# Patient Record
Sex: Female | Born: 1940 | Race: White | Hispanic: No | Marital: Married | Smoking: Never smoker
Health system: Southern US, Community
[De-identification: ages and names within clinical notes are randomized; demographics above are authoritative.]

## PROBLEM LIST (undated history)

## (undated) DIAGNOSIS — N200 Calculus of kidney: Secondary | ICD-10-CM

## (undated) DIAGNOSIS — F32A Depression, unspecified: Secondary | ICD-10-CM

## (undated) DIAGNOSIS — R32 Unspecified urinary incontinence: Secondary | ICD-10-CM

## (undated) DIAGNOSIS — H269 Unspecified cataract: Secondary | ICD-10-CM

## (undated) DIAGNOSIS — E785 Hyperlipidemia, unspecified: Secondary | ICD-10-CM

## (undated) DIAGNOSIS — J45909 Unspecified asthma, uncomplicated: Secondary | ICD-10-CM

## (undated) DIAGNOSIS — E079 Disorder of thyroid, unspecified: Secondary | ICD-10-CM

## (undated) DIAGNOSIS — Z9109 Other allergy status, other than to drugs and biological substances: Secondary | ICD-10-CM

## (undated) DIAGNOSIS — Z8719 Personal history of other diseases of the digestive system: Secondary | ICD-10-CM

## (undated) DIAGNOSIS — F419 Anxiety disorder, unspecified: Secondary | ICD-10-CM

## (undated) DIAGNOSIS — N189 Chronic kidney disease, unspecified: Secondary | ICD-10-CM

## (undated) DIAGNOSIS — M1990 Unspecified osteoarthritis, unspecified site: Secondary | ICD-10-CM

## (undated) DIAGNOSIS — F329 Major depressive disorder, single episode, unspecified: Secondary | ICD-10-CM

## (undated) DIAGNOSIS — K219 Gastro-esophageal reflux disease without esophagitis: Secondary | ICD-10-CM

## (undated) DIAGNOSIS — E119 Type 2 diabetes mellitus without complications: Secondary | ICD-10-CM

## (undated) DIAGNOSIS — Z87442 Personal history of urinary calculi: Secondary | ICD-10-CM

## (undated) DIAGNOSIS — I10 Essential (primary) hypertension: Secondary | ICD-10-CM

## (undated) HISTORY — DX: Disorder of thyroid, unspecified: E07.9

## (undated) HISTORY — DX: Depression, unspecified: F32.A

## (undated) HISTORY — DX: Unspecified cataract: H26.9

## (undated) HISTORY — DX: Unspecified asthma, uncomplicated: J45.909

## (undated) HISTORY — DX: Unspecified osteoarthritis, unspecified site: M19.90

## (undated) HISTORY — DX: Other allergy status, other than to drugs and biological substances: Z91.09

## (undated) HISTORY — DX: Chronic kidney disease, unspecified: N18.9

## (undated) HISTORY — PX: KIDNEY STONE SURGERY: SHX686

## (undated) HISTORY — DX: Essential (primary) hypertension: I10

## (undated) HISTORY — DX: Type 2 diabetes mellitus without complications: E11.9

## (undated) HISTORY — DX: Major depressive disorder, single episode, unspecified: F32.9

## (undated) HISTORY — DX: Unspecified urinary incontinence: R32

## (undated) HISTORY — DX: Gastro-esophageal reflux disease without esophagitis: K21.9

## (undated) HISTORY — PX: NASAL SINUS SURGERY: SHX719

## (undated) HISTORY — DX: Calculus of kidney: N20.0

## (undated) HISTORY — DX: Hyperlipidemia, unspecified: E78.5

## (undated) HISTORY — PX: BREAST BIOPSY: SHX20

---

## 1972-07-12 HISTORY — PX: ABDOMINAL HYSTERECTOMY: SHX81

## 1993-07-12 HISTORY — PX: CHOLECYSTECTOMY: SHX55

## 2006-05-11 ENCOUNTER — Ambulatory Visit: Payer: Self-pay | Admitting: Specialist

## 2007-08-01 ENCOUNTER — Ambulatory Visit: Payer: Self-pay | Admitting: Urology

## 2010-06-30 ENCOUNTER — Ambulatory Visit: Payer: Self-pay | Admitting: Orthopedic Surgery

## 2010-10-03 LAB — HM MAMMOGRAPHY: HM Mammogram: NORMAL

## 2010-10-03 LAB — HM PAP SMEAR: HM Pap smear: NORMAL

## 2011-08-02 ENCOUNTER — Ambulatory Visit: Payer: Self-pay | Admitting: Urology

## 2012-10-02 ENCOUNTER — Ambulatory Visit (INDEPENDENT_AMBULATORY_CARE_PROVIDER_SITE_OTHER): Payer: Medicare Other | Admitting: Internal Medicine

## 2012-10-02 ENCOUNTER — Encounter: Payer: Self-pay | Admitting: Internal Medicine

## 2012-10-02 VITALS — BP 142/98 | HR 73 | Temp 98.1°F | Ht 61.5 in | Wt 173.0 lb

## 2012-10-02 DIAGNOSIS — E119 Type 2 diabetes mellitus without complications: Secondary | ICD-10-CM | POA: Insufficient documentation

## 2012-10-02 DIAGNOSIS — I10 Essential (primary) hypertension: Secondary | ICD-10-CM | POA: Insufficient documentation

## 2012-10-02 DIAGNOSIS — E785 Hyperlipidemia, unspecified: Secondary | ICD-10-CM | POA: Insufficient documentation

## 2012-10-02 DIAGNOSIS — Z1239 Encounter for other screening for malignant neoplasm of breast: Secondary | ICD-10-CM | POA: Insufficient documentation

## 2012-10-02 DIAGNOSIS — E039 Hypothyroidism, unspecified: Secondary | ICD-10-CM | POA: Insufficient documentation

## 2012-10-02 DIAGNOSIS — F329 Major depressive disorder, single episode, unspecified: Secondary | ICD-10-CM | POA: Insufficient documentation

## 2012-10-02 NOTE — Assessment & Plan Note (Signed)
Will request records on previous evaluation and management. Will check renal function with labs.

## 2012-10-02 NOTE — Assessment & Plan Note (Signed)
Mammogram ordered today 

## 2012-10-02 NOTE — Progress Notes (Signed)
Subjective:    Patient ID: Brianna Cole, female    DOB: [birth year], 72 y.o.   MRN: [id number]  HPI 72YO female with DM, HTN, HL, CKD3, depression, allergies presents to establish care.  DM - reports BG have been well controlled with Tradjenta. No low blood sugars or BG>250. Unsure last A1c, however this was drawn in 06/2012. HTN - BP generally well controlled at home. Notes difficult to control in the past. Compliant with diltiazem. Notes some constipation on this medication, which has been managable with Senakot. HL - Compliant with pravastatin. No side effects noted from this medication. Depression - Symptoms well controlled with Sertraline and Wellbutrin. CKD3- followed by nephrologist and urologist for chronic kidney stones. No recent issues. Allergies - Followed by allergist. Weekly allergy shots. Symptomatically doing well. No new concerns today.     Outpatient Encounter Prescriptions as of 10/02/2012  Medication Sig Dispense Refill  . albuterol (PROAIR HFA) 108 (90 BASE) MCG/ACT inhaler Inhale 2 puffs into the lungs every 6 (six) hours as needed for wheezing.      Marland Kitchen buPROPion (WELLBUTRIN SR) 150 MG 12 hr tablet Take 150 mg by mouth 2 (two) times daily.       Marland Kitchen diltiazem (CARDIZEM CD) 180 MG 24 hr capsule Take 180 mg by mouth daily.       . fluticasone (FLONASE) 50 MCG/ACT nasal spray Place 2 sprays into the nose daily.      . Fluticasone-Salmeterol (ADVAIR) 100-50 MCG/DOSE AEPB Inhale 1 puff into the lungs every 12 (twelve) hours.      . gabapentin (NEURONTIN) 300 MG capsule Take 300 mg by mouth 3 (three) times daily.       . lansoprazole (PREVACID) 15 MG capsule Take 15 mg by mouth daily.      . montelukast (SINGULAIR) 10 MG tablet Take 10 mg by mouth at bedtime.       . potassium citrate (UROCIT-K) 10 MEQ (1080 MG) SR tablet Take 10 mEq by mouth 4 (four) times daily.       . pravastatin (PRAVACHOL) 20 MG tablet Take 20 mg by mouth daily.       . sertraline (ZOLOFT) 100 MG tablet  Take 100 mg by mouth daily.       Marland Kitchen SYNTHROID 75 MCG tablet Take 75 mcg by mouth daily.       . TRADJENTA 5 MG TABS tablet Take 5 mg by mouth daily.       . Vitamin D, Ergocalciferol, (DRISDOL) 50000 UNITS CAPS Take 50,000 Units by mouth every 7 (seven) days.       No facility-administered encounter medications on file as of 10/02/2012.   BP 142/98  Pulse 73  Temp(Src) 98.1 F (36.7 C) (Oral)  Ht 5' 1.5" (1.562 m)  Wt 173 lb (78.472 kg)  BMI 32.16 kg/m2  SpO2 96%  Review of Systems  Constitutional: Negative for fever, chills, appetite change, fatigue and unexpected weight change.  HENT: Negative for ear pain, congestion, sore throat, trouble swallowing, neck pain, voice change and sinus pressure.   Eyes: Negative for visual disturbance.  Respiratory: Negative for cough, shortness of breath, wheezing and stridor.   Cardiovascular: Negative for chest pain, palpitations and leg swelling.  Gastrointestinal: Negative for nausea, vomiting, abdominal pain, diarrhea, constipation, blood in stool, abdominal distention and anal bleeding.  Genitourinary: Negative for dysuria and flank pain.  Musculoskeletal: Negative for myalgias, arthralgias and gait problem.  Skin: Negative for color change and rash.  Neurological: Negative for dizziness  and headaches.  Hematological: Negative for adenopathy. Does not bruise/bleed easily.  Psychiatric/Behavioral: Negative for suicidal ideas, sleep disturbance and dysphoric mood. The patient is not nervous/anxious.        Objective:   Physical Exam  Constitutional: She is oriented to person, place, and time. She appears well-developed and well-nourished. No distress.  HENT:  Head: Normocephalic and atraumatic.  Right Ear: External ear normal.  Left Ear: External ear normal.  Nose: Nose normal.  Mouth/Throat: Oropharynx is clear and moist. No oropharyngeal exudate.  Eyes: Conjunctivae are normal. Pupils are equal, round, and reactive to light. Right eye  exhibits no discharge. Left eye exhibits no discharge. No scleral icterus.  Neck: Normal range of motion. Neck supple. No tracheal deviation present. No thyromegaly present.  Cardiovascular: Normal rate, regular rhythm, normal heart sounds and intact distal pulses.  Exam reveals no gallop and no friction rub.   No murmur heard. Pulmonary/Chest: Effort normal and breath sounds normal. No respiratory distress. She has no wheezes. She has no rales. She exhibits no tenderness.  Abdominal: Soft. Bowel sounds are normal. She exhibits no distension. There is no tenderness.  Musculoskeletal: Normal range of motion. She exhibits no edema and no tenderness.  Lymphadenopathy:    She has no cervical adenopathy.  Neurological: She is alert and oriented to person, place, and time. No cranial nerve deficit. She exhibits normal muscle tone. Coordination normal.  Skin: Skin is warm and dry. No rash noted. She is not diaphoretic. No erythema. No pallor.  Psychiatric: She has a normal mood and affect. Her behavior is normal. Judgment and thought content normal.          Assessment & Plan:

## 2012-10-02 NOTE — Assessment & Plan Note (Signed)
Will check A1c with labs. Continue Tradjenta. Follow up 3 months and prn.

## 2012-10-02 NOTE — Assessment & Plan Note (Signed)
BP Readings from Last 3 Encounters:  10/02/12 142/98   BP generally well controlled on Diltiazem. However slightly elevated today. Will continue current medications. Pt will monitor BP at home and call if BP consistently >140/90.

## 2012-10-02 NOTE — Assessment & Plan Note (Signed)
Will check TSH with labs. Continue levothyroxine. 

## 2012-10-02 NOTE — Assessment & Plan Note (Signed)
Will check lipids with fasting labs. Continue Pravastatin.

## 2012-10-02 NOTE — Assessment & Plan Note (Signed)
Symptoms well controlled on Sertraline and Wellbutrin. Will continue.

## 2012-10-05 ENCOUNTER — Ambulatory Visit: Payer: Self-pay

## 2012-10-09 ENCOUNTER — Other Ambulatory Visit (INDEPENDENT_AMBULATORY_CARE_PROVIDER_SITE_OTHER): Payer: Medicare Other

## 2012-10-09 DIAGNOSIS — E785 Hyperlipidemia, unspecified: Secondary | ICD-10-CM

## 2012-10-09 DIAGNOSIS — E119 Type 2 diabetes mellitus without complications: Secondary | ICD-10-CM

## 2012-10-09 DIAGNOSIS — E039 Hypothyroidism, unspecified: Secondary | ICD-10-CM

## 2012-10-09 DIAGNOSIS — I10 Essential (primary) hypertension: Secondary | ICD-10-CM

## 2012-10-09 LAB — COMPREHENSIVE METABOLIC PANEL
ALT: 19 U/L (ref 0–35)
AST: 24 U/L (ref 0–37)
Calcium: 9.5 mg/dL (ref 8.4–10.5)
Chloride: 101 mEq/L (ref 96–112)
Creatinine, Ser: 1.3 mg/dL — ABNORMAL HIGH (ref 0.4–1.2)
Potassium: 4.1 mEq/L (ref 3.5–5.1)
Sodium: 138 mEq/L (ref 135–145)

## 2012-10-09 LAB — LIPID PANEL
Cholesterol: 168 mg/dL (ref 0–200)
LDL Cholesterol: 96 mg/dL (ref 0–99)
VLDL: 22.8 mg/dL (ref 0.0–40.0)

## 2012-10-12 ENCOUNTER — Telehealth: Payer: Self-pay | Admitting: *Deleted

## 2012-10-12 NOTE — Telephone Encounter (Signed)
Latoya from [facility] called to find out the status of the medical review form that was faxed on the patient 4-2.

## 2012-10-13 NOTE — Telephone Encounter (Signed)
I have not seen this

## 2012-10-13 NOTE — Telephone Encounter (Signed)
Have you received any information about this?

## 2012-10-17 NOTE — Telephone Encounter (Signed)
Called and spoke with Brianna Cole, she will fax it again. It is a med review the physician has to either approve or deny

## 2012-10-24 NOTE — Telephone Encounter (Signed)
Received form and it is a review of patient medication, no signature needed.

## 2012-11-08 ENCOUNTER — Ambulatory Visit: Payer: Self-pay | Admitting: Ophthalmology

## 2012-11-08 LAB — POTASSIUM: Potassium: 4.4 mmol/L (ref 3.5–5.1)

## 2012-11-20 ENCOUNTER — Ambulatory Visit: Payer: Self-pay | Admitting: Ophthalmology

## 2012-12-02 ENCOUNTER — Other Ambulatory Visit: Payer: Self-pay | Admitting: Internal Medicine

## 2012-12-05 NOTE — Telephone Encounter (Signed)
Rx sent to pharmacy by escript  

## 2012-12-06 ENCOUNTER — Encounter: Payer: Self-pay | Admitting: Internal Medicine

## 2013-01-04 ENCOUNTER — Ambulatory Visit (INDEPENDENT_AMBULATORY_CARE_PROVIDER_SITE_OTHER): Payer: Medicare Other | Admitting: Internal Medicine

## 2013-01-18 ENCOUNTER — Other Ambulatory Visit: Payer: Self-pay | Admitting: Internal Medicine

## 2013-01-18 NOTE — Telephone Encounter (Signed)
Eprescribed.

## 2013-01-30 ENCOUNTER — Ambulatory Visit: Payer: Medicare Other | Admitting: Internal Medicine

## 2013-09-11 ENCOUNTER — Ambulatory Visit: Payer: Self-pay | Admitting: Ophthalmology

## 2013-09-11 LAB — POTASSIUM: POTASSIUM: 3.7 mmol/L (ref 3.5–5.1)

## 2013-09-17 ENCOUNTER — Other Ambulatory Visit: Payer: Self-pay | Admitting: Podiatry

## 2013-09-24 ENCOUNTER — Ambulatory Visit: Payer: Self-pay | Admitting: Ophthalmology

## 2013-10-27 ENCOUNTER — Other Ambulatory Visit: Payer: Self-pay | Admitting: Podiatry

## 2013-11-28 ENCOUNTER — Other Ambulatory Visit: Payer: Self-pay | Admitting: Podiatry

## 2013-11-28 NOTE — Telephone Encounter (Signed)
Per dr Milinda Pointer ok plus a year of refills

## 2013-11-28 NOTE — Telephone Encounter (Signed)
May refill and provide one year refill.

## 2013-11-28 NOTE — Telephone Encounter (Signed)
Jody, please check this refill with Dr Milinda Pointer.  Marcy Siren

## 2014-11-01 NOTE — Op Note (Signed)
PATIENT NAME:  Brianna Cole, Brianna Cole MR#:  [id number] DATE OF BIRTH:  22-Jun-1941  DATE OF PROCEDURE:  11/20/2012  PREOPERATIVE DIAGNOSIS: Cataract, left eye.   POSTOPERATIVE DIAGNOSIS: Cataract, left eye.  PROCEDURE PERFORMED: Extracapsular cataract extraction using phacoemulsification with placement of Alcon Sn6CWS 24.0-diopter posterior chamber lens, serial number [id number].   SURGEON: Brianna Back. Danette Weinfeld, M.D.   ANESTHESIA: 4% lidocaine and 0.75% Marcaine a 50-50 mixture with 10 units/mL of Hyalex added, given as a peribulbar.   ANESTHESIOLOGIST: Dr. Ronelle Nigh.   COMPLICATIONS: None.   ESTIMATED BLOOD LOSS: Less than 1 mL.   DESCRIPTION OF PROCEDURE:  The patient was brought to the operating room and given a peribulbar block.  The patient was then prepped and draped in the usual fashion.  The vertical rectus muscles were imbricated using 5-0 silk sutures.  These sutures were then clamped to the sterile drapes as bridle sutures.  A limbal peritomy was performed extending two clock hours and hemostasis was obtained with cautery.  A partial thickness scleral groove was made at the surgical limbus and dissected anteriorly in a lamellar dissection using an Alcon crescent knife.  The anterior chamber was entered supero-temporally with a Superblade and through the lamellar dissection with a 2.6 mm keratome.  DisCoVisc was used to replace the aqueous and a continuous tear capsulorrhexis was carried out.  Hydrodissection and hydrodelineation were carried out with balanced salt and a 27 gauge canula.  The nucleus was rotated to confirm the effectiveness of the hydrodissection.  Phacoemulsification was carried out using a divide-and-conquer technique.  Total ultrasound time was 1 minutes and 35 seconds with an average power of 24.5%. CDE 36.41.  Irrigation/aspiration was used to remove the residual cortex.  DisCoVisc was used to inflate the capsule and the internal incision was enlarged to 3 mm with the  crescent knife.  The intraocular lens was folded and inserted into the capsular bag using the AcrySert delivery system.  Irrigation/aspiration was used to remove the residual DisCoVisc.  Miostat was injected into the anterior chamber through the paracentesis track to inflate the anterior chamber and induce miosis.  Cefuroxime 10 mL was injected through the paracentesis track. The wound was checked for leaks and wound leakage was found.  A single 10-0 suture was placed across the incision, tied and the knot was rotated superiorly.  The conjunctiva was closed with cautery and the bridle sutures were removed.  Two drops of 0.3% Vigamox were placed on the eye.   An eye shield was placed on the eye.  The patient was discharged to the recovery room in good condition.  ____________________________ Brianna Back Arley Salamone, MD sad:aw D: 11/20/2012 14:46:47 ET T: 11/21/2012 08:23:53 ET JOB#: [id number]  cc: Remo Lipps A. Masao Junker, MD, <Dictator> Martie Lee MD ELECTRONICALLY SIGNED 11/22/2012 11:53

## 2014-11-02 NOTE — Op Note (Signed)
PATIENT NAME:  Brianna Cole, Brianna Cole MR#:  [id number] DATE OF BIRTH:  1941-02-21  DATE OF PROCEDURE:  09/24/2013  PREOPERATIVE DIAGNOSIS: Cataract, right eye.   POSTOPERATIVE DIAGNOSIS: Cataract, right eye.  PROCEDURE PERFORMED: Extracapsular cataract extraction using phacoemulsification with placement of Alcon [id number] 24.0-diopter posterior chamber lens, serial number [id number].   SURGEON: Loura Back. Maylene Crocker, M.D.   ANESTHESIA: 4% lidocaine, 0.75% Marcaine, 50-50 mixture with 10 units/mL of Hylenex added given as a peribulbar.   ANESTHESIOLOGIST: Dr. Benjamine Mola.   COMPLICATIONS: None.   ESTIMATED BLOOD LOSS: Less than 1 mL.   DESCRIPTION OF PROCEDURE:  Because of LATEX ALLERGY, latex-free materials were used throughout the procedure. The patient was brought to the operating room and given IV sedation and a peribulbar block. She was then prepped and draped in the usual fashion. The vertical rectus muscles were imbricated using 5-0 silk sutures, bridle sutures and hemostasis was obtained at superior limbus with cautery. A partial thickness scleral groove was made at the surgical limbus and dissected anteriorly into clear cornea with an Alcon crescent knife. The anterior chamber was entered superonasally through clear cornea with paracentesis knife and through the lamellar dissection with a 2.6 mm keratome. DisCoVisc was used to place the aqueous and a continuous tear of circular capsulorrhexis was carried out. Hydrodissection was used to loosen the nucleus and phacoemulsification was carried out in a divide-and-conquer technique. Ultrasound time was 59 seconds with an average power of 23.1% The CDE was 24.03. Irrigation/aspiration was used to remove the residual cortex. The capsular bag was inflated with DisCoVisc and the intraocular lens was inserted in the capsular bag using a [employer]. Irrigation and aspiration were used to remove the residual cortex, the residual DisCoVisc, and the wound was  inflated with balanced salt. Miochol was injected via the paracentesis tract and 0.1 mL of cefuroxime containing 1 mg of drug was injected right after that. The wound was checked for leaks; none were found. The bridle sutures were removed. Two drops of Vigamox were placed in the eye. A shield was placed on the eye, and the patient was discharged to the recovery room in good condition.    ____________________________ Loura Back Julies Carmickle, MD sad:dmm D: 09/24/2013 13:49:18 ET T: 09/24/2013 21:51:45 ET JOB#: [id number]  cc: Remo Lipps A. Shenicka Sunderlin, MD, <Dictator> Martie Lee MD ELECTRONICALLY SIGNED 10/01/2013 13:55

## 2014-12-25 ENCOUNTER — Ambulatory Visit: Payer: Self-pay

## 2014-12-26 ENCOUNTER — Ambulatory Visit: Payer: Self-pay

## 2015-01-02 ENCOUNTER — Other Ambulatory Visit: Payer: Self-pay | Admitting: Podiatry

## 2015-01-02 NOTE — Telephone Encounter (Signed)
Pt was seen last prior to 2015 and needs to be evaluated for long-term medication management.

## 2015-02-11 ENCOUNTER — Encounter: Payer: Self-pay | Admitting: *Deleted

## 2015-02-12 ENCOUNTER — Ambulatory Visit (INDEPENDENT_AMBULATORY_CARE_PROVIDER_SITE_OTHER): Payer: Medicare Other | Admitting: Urology

## 2015-02-12 ENCOUNTER — Ambulatory Visit
Admission: RE | Admit: 2015-02-12 | Discharge: 2015-02-12 | Disposition: A | Discharge status: Home / Self Care | Payer: Medicare Other | Source: Ambulatory Visit | Attending: Urology | Admitting: Urology

## 2015-02-12 ENCOUNTER — Encounter: Payer: Self-pay | Admitting: Urology

## 2015-02-12 VITALS — BP 115/70 | HR 69 | Ht 65.0 in | Wt 188.8 lb

## 2015-02-12 DIAGNOSIS — N952 Postmenopausal atrophic vaginitis: Secondary | ICD-10-CM

## 2015-02-12 DIAGNOSIS — R312 Other microscopic hematuria: Secondary | ICD-10-CM | POA: Diagnosis not present

## 2015-02-12 DIAGNOSIS — R32 Unspecified urinary incontinence: Secondary | ICD-10-CM | POA: Diagnosis not present

## 2015-02-12 DIAGNOSIS — N200 Calculus of kidney: Secondary | ICD-10-CM

## 2015-02-12 DIAGNOSIS — R3129 Other microscopic hematuria: Secondary | ICD-10-CM

## 2015-02-12 LAB — MICROSCOPIC EXAMINATION

## 2015-02-12 LAB — URINALYSIS, COMPLETE
Bilirubin, UA: NEGATIVE
GLUCOSE, UA: NEGATIVE
Ketones, UA: NEGATIVE
NITRITE UA: NEGATIVE
PH UA: 7 (ref 5.0–7.5)
Specific Gravity, UA: 1.02 (ref 1.005–1.030)
UUROB: 0.2 mg/dL (ref 0.2–1.0)

## 2015-02-12 LAB — BLADDER SCAN AMB NON-IMAGING: Scan Result: 30

## 2015-02-12 NOTE — Progress Notes (Signed)
02/12/2015 1:18 PM   Brianna Cole 20-Sep-1940 [id number]  Referring provider: Jackolyn Confer, MD [address] [place], [place] [address]  Chief Complaint  Patient presents with  . Nephrolithiasis    patient states leaking    HPI: Brianna Cole is a 74 year old white female with a history of nephrolithiasis and incontinence.  Incontinence: Patient has a long-standing history of urinary incontinence. She has tried anticholinergics in the past without effectiveness. She had tried Myrbetriq 2 years ago and experienced urinary retention with that medication.  She wears depends changing 2-3 pads daily.  She experiences leakage when she laughs or sneezes. She also loses urine when she rises to a standing position.     She is not experiencing fevers, chills, nausea or vomiting. She also denies any gross hematuria.  Her PVR is 30 mL. Her urinalysis is suspicious for infection with greater than the 30 WBCs and 3-10 RBCs per high-power field.  Nephrolithiasis: Patient has a stable right mid pole nephrolithiasis.  She has not had any flank pain or gross hematuria. She was previously on Urocit-K, but her renal function was starting to decline so that medication was discontinued.     PMH: Past Medical History  Diagnosis Date  . Arthritis   . Asthma   . Depression   . Diabetes mellitus without complication   . GERD (gastroesophageal reflux disease)   . Hypertension   . Hyperlipidemia   . Chronic kidney disease   . Thyroid disease   . Kidney stones   . Cataract   . Environmental allergies     Followed by Dr. Donneta Romberg  . Urinary incontinence     Surgical History: Past Surgical History  Procedure Laterality Date  . Cholecystectomy  1995  . Abdominal hysterectomy  1974  . Vaginal delivery      2, 1x forceps delivery    Home Medications:    Medication List       This list is accurate as of: 02/12/15 11:59 PM.  Always use your most recent med list.               buPROPion 150 MG 12 hr tablet  Commonly known as:  WELLBUTRIN SR  Take 150 mg by mouth 2 (two) times daily.     diltiazem 180 MG 24 hr capsule  Commonly known as:  CARDIZEM CD  Take 1 capsule (180 mg total) by mouth daily.     fexofenadine 180 MG tablet  Commonly known as:  ALLEGRA  TAKE 1 TABLET BY MOUTH ONCE A DAY (OTC*)     fluticasone 50 MCG/ACT nasal spray  Commonly known as:  FLONASE  Place 2 sprays into the nose daily.     Fluticasone-Salmeterol 100-50 MCG/DOSE Aepb  Commonly known as:  ADVAIR  Inhale 1 puff into the lungs every 12 (twelve) hours.     gabapentin 300 MG capsule  Commonly known as:  NEURONTIN  TAKE 1 CAPSULE BY MOUTH 3 TIMES DAILY     lansoprazole 15 MG capsule  Commonly known as:  PREVACID  Take 15 mg by mouth daily.     montelukast 10 MG tablet  Commonly known as:  SINGULAIR  Take 10 mg by mouth at bedtime.     ONE TOUCH ULTRA TEST test strip  Generic drug:  glucose blood     potassium citrate 10 MEQ (1080 MG) SR tablet  Commonly known as:  UROCIT-K  Take 10 mEq by mouth 4 (four)  times daily.     pravastatin 20 MG tablet  Commonly known as:  PRAVACHOL  Take 20 mg by mouth daily.     PROAIR HFA 108 (90 BASE) MCG/ACT inhaler  Generic drug:  albuterol  Inhale 2 puffs into the lungs every 6 (six) hours as needed for wheezing.     sertraline 100 MG tablet  Commonly known as:  ZOLOFT  Take 100 mg by mouth daily.     SYNTHROID 75 MCG tablet  Generic drug:  levothyroxine  1 BY MOUTH EVERY MORNING , DO NOT SUBSTITUTE PLEASE.Marland Kitchen REPLACESDISCONTINUED LEVOXYL     TRADJENTA 5 MG Tabs tablet  Generic drug:  linagliptin  Take 5 mg by mouth daily.     traZODone 50 MG tablet  Commonly known as:  DESYREL     Vitamin D (Ergocalciferol) 50000 UNITS Caps capsule  Commonly known as:  DRISDOL  Take 50,000 Units by mouth every 7 (seven) days.     zoledronic acid 5 MG/100ML Soln injection  Commonly known as:  RECLAST        Allergies:   Allergies  Allergen Reactions  . Shellfish Allergy Hives and Swelling  . Wheat Bran Other (See Comments)  . Iodinated Diagnostic Agents Rash    Family History: Family History  Problem Relation Age of Onset  . Arthritis Mother   . Hyperlipidemia Mother   . Hypertension Mother   . Kidney disease Mother   . Diabetes Mother   . Cancer Mother     colon  . Cancer Father     lung  . Bladder Cancer Neg Hx     Social History:  reports that she has never smoked. She has never used smokeless tobacco. She reports that she does not drink alcohol or use illicit drugs.  ROS: UROLOGY Frequent Urination?: Yes Hard to postpone urination?: Yes Burning/pain with urination?: No Get up at night to urinate?: No Leakage of urine?: No Urine stream starts and stops?: No Trouble starting stream?: No Do you have to strain to urinate?: No Blood in urine?: No Urinary tract infection?: No Sexually transmitted disease?: No Injury to kidneys or bladder?: No Painful intercourse?: No Weak stream?: No Currently pregnant?: No Vaginal bleeding?: No Last menstrual period?: n  Gastrointestinal Nausea?: No Vomiting?: No Indigestion/heartburn?: No Diarrhea?: No Constipation?: Yes  Constitutional Fever: No Night sweats?: No Weight loss?: No Fatigue?: Yes  Skin Skin rash/lesions?: No Itching?: No  Eyes Blurred vision?: Yes Double vision?: No  Ears/Nose/Throat Sore throat?: No Sinus problems?: Yes  Hematologic/Lymphatic Swollen glands?: No Easy bruising?: Yes  Cardiovascular Leg swelling?: No Chest pain?: No  Respiratory Cough?: No Shortness of breath?: No  Endocrine Excessive thirst?: No  Musculoskeletal Back pain?: Yes Joint pain?: No  Neurological Headaches?: Yes Dizziness?: No  Psychologic Depression?: Yes Anxiety?: Yes  Physical Exam: BP 115/70 mmHg  Pulse 69  Ht 5\' 5"  (1.651 m)  Wt 188 lb 12.8 oz (85.639 kg)  BMI 31.42 kg/m2  GU:  Atrophic external  genitalia.  Normal urethral meatus. No urethral masses and/or tenderness. No bladder fullness or masses. Grade II cystocele.  No vaginal lesions or discharge. Normal rectal tone, no masses. Normal anus and perineum.   Laboratory Data: Results for orders placed or performed in visit on 02/12/15  Microscopic Examination  Result Value Ref Range   WBC, UA >30W 0 -  5 /hpf   RBC, UA 3-10 (A) 0 -  2 /hpf   Epithelial Cells (non renal) 0-10 0 - 10 /  hpf   Casts Present (A) None seen /lpf   Cast Type Hyaline casts N/A   Crystals Present (A) N/A   Crystal Type Calcium Oxalate N/A   Bacteria, UA Few None seen/Few  Urinalysis, Complete  Result Value Ref Range   Specific Gravity, UA 1.020 1.005 - 1.030   pH, UA 7.0 5.0 - 7.5   Color, UA Yellow Yellow   Appearance Ur Clear Clear   Leukocytes, UA 2+ (A) Negative   Protein, UA 1+ (A) Negative/Trace   Glucose, UA Negative Negative   Ketones, UA Negative Negative   RBC, UA Trace (A) Negative   Bilirubin, UA Negative Negative   Urobilinogen, Ur 0.2 0.2 - 1.0 mg/dL   Nitrite, UA Negative Negative   Microscopic Examination See below:   BLADDER SCAN AMB NON-IMAGING  Result Value Ref Range   Scan Result 30     No results found for: WBC, HGB, HCT, MCV, PLT  Lab Results  Component Value Date   CREATININE 1.3* 10/09/2012    No results found for: PSA  No results found for: TESTOSTERONE  Lab Results  Component Value Date   HGBA1C 5.2 10/09/2012    Urinalysis    Component Value Date/Time   GLUCOSEU Negative 02/12/2015 1405   BILIRUBINUR Negative 02/12/2015 1405   NITRITE Negative 02/12/2015 1405   LEUKOCYTESUR 2+* 02/12/2015 1405    Pertinent Imaging: CLINICAL DATA: Kidney stones. Frequent urination. No pain.  EXAM: ABDOMEN - 1 VIEW  COMPARISON: 10/05/2012  FINDINGS: 11 mm calcification projects over the mid pole of the right kidney, stable since prior study. Prior cholecystectomy. No visible calcifications over the  expected course of the ureters or bladder. Nonobstructive bowel gas pattern. No organomegaly. Lung bases are clear.  IMPRESSION: Right nephrolithiasis, stable.   Electronically Signed  By: Rolm Baptise M.D.  On: 02/12/2015 15:17   Assessment & Plan:    1. Kidney stones:   Patient with stable right nephrolithiasis. She did have microscopic hematuria on today's UA, however, the UA is suspicious for infection and I will be sending that for culture.  2. Microscopic hematuria:   Patient had 3-10 RBCs per high-power field on her urinalysis today. She also had greater than 30 WBCs. I will be sending the urine for culture today. We will continue to monitor her urine to ensure the hematuria does not persist.  - Urinalysis, Complete  3. Incontinence:   Patient has failed anticholinergics and Myrbetriq. She does have a cystocele. Her incontinence is most likely due to stress.  Her PVR is minimal, so it is unlikely she has overflow incontinence.  I have started her on vaginal estrogen cream for atrophic vaginitis today and we will reassess her incontinence when she returns.   She may need referral for a pessary fitting.  - BLADDER SCAN AMB NON-IMAGING  4. Atrophic vaginitis:   Patient was given a sample of vaginal estrogen cream (Estrace) and instructed to apply 0.5mg  (pea-sized amount)  just inside the vaginal introitus with a finger-tip every night for two weeks and then Monday, Wednesday and Friday nights.  I explained to the patient that vaginally administered estrogen, which causes only a slight increase in the blood estrogen levels, have fewer contraindications and adverse systemic effects that oral HT.   Return in about 2 weeks (around 02/26/2015) for KUB results and exam.  Zara Council, [facility] [address], [place], [place] [address] [phone]

## 2015-02-13 DIAGNOSIS — N200 Calculus of kidney: Secondary | ICD-10-CM | POA: Insufficient documentation

## 2015-02-13 DIAGNOSIS — N952 Postmenopausal atrophic vaginitis: Secondary | ICD-10-CM | POA: Insufficient documentation

## 2015-02-13 DIAGNOSIS — R3129 Other microscopic hematuria: Secondary | ICD-10-CM | POA: Insufficient documentation

## 2015-02-13 DIAGNOSIS — R32 Unspecified urinary incontinence: Secondary | ICD-10-CM | POA: Insufficient documentation

## 2015-02-14 LAB — CULTURE, URINE COMPREHENSIVE

## 2015-03-03 ENCOUNTER — Ambulatory Visit
Admission: RE | Admit: 2015-03-03 | Discharge: 2015-03-03 | Disposition: A | Discharge status: Home / Self Care | Payer: Medicare Other | Source: Ambulatory Visit | Attending: Urology | Admitting: Urology

## 2015-03-03 ENCOUNTER — Encounter: Payer: Self-pay | Admitting: Urology

## 2015-03-03 ENCOUNTER — Ambulatory Visit (INDEPENDENT_AMBULATORY_CARE_PROVIDER_SITE_OTHER): Payer: Medicare Other | Admitting: Urology

## 2015-03-03 VITALS — BP 131/77 | HR 72 | Ht 62.0 in | Wt 187.3 lb

## 2015-03-03 DIAGNOSIS — N952 Postmenopausal atrophic vaginitis: Secondary | ICD-10-CM

## 2015-03-03 DIAGNOSIS — R3129 Other microscopic hematuria: Secondary | ICD-10-CM

## 2015-03-03 DIAGNOSIS — N200 Calculus of kidney: Secondary | ICD-10-CM

## 2015-03-03 DIAGNOSIS — R32 Unspecified urinary incontinence: Secondary | ICD-10-CM | POA: Diagnosis not present

## 2015-03-03 DIAGNOSIS — R312 Other microscopic hematuria: Secondary | ICD-10-CM

## 2015-03-03 MED ORDER — ESTRADIOL 0.1 MG/GM VA CREA: 1.0000 | TOPICAL_CREAM | Freq: Every day | VAGINAL | Status: DC

## 2015-03-03 NOTE — Progress Notes (Signed)
03/03/2015 8:54 PM   Brianna Cole 26-May-1941 [id number]  Referring provider: Jackolyn Confer, MD [address] [place],  [address]  Chief Complaint  Patient presents with  . Hematuria    microscopic 2 week recheck  . Vaginitis    atrophic 2 week recheck    HPI: Brianna Cole is a 74 year old white female who presents today for a 2 week recheck on her atrophic vaginitis and microscopic hematuria.  She also has a history of incontinence and nephrolithiasis.  Microscopic hematuria Patient had 3-10 RBCs per high-power field on her urinalysis on 02/12/2015. She did not have microscopic hematuria on today's cath specimen. She denied any gross hematuria.  Atrophic vaginitis Patient has a history of atrophic vaginitis. She is continuing to use her vaginal estrogen cream 3 nights weekly with good results.  Incontinence Patient has episodes of urge and stress incontinence.  She wears depends changing 2-3 pads daily. She experiences leakage when she laughs or sneezes. She also loses urine when she rises to a standing position. She did experience urinary retention with Myrbetriq and had to discontinue that medication.  Nephrolithiasis: Patient has a stable right mid pole nephrolithiasis. She has not had any flank pain or gross hematuria. She was previously on Urocit-K, but her renal function was starting to decline so that medication was discontinued.  Patient has been having episodes of dizziness which she is currently being evaluated for with her primary care physician.  She was unsteady at the initial point of her of visit, but it resolved before the patient left the office.   PMH: Past Medical History  Diagnosis Date  . Arthritis   . Asthma   . Depression   . Diabetes mellitus without complication   . GERD (gastroesophageal reflux disease)   . Hypertension   . Hyperlipidemia   . Chronic kidney disease   . Thyroid disease   . Kidney stones   .  Cataract   . Environmental allergies     Followed by Dr. Donneta Romberg  . Urinary incontinence     Surgical History: Past Surgical History  Procedure Laterality Date  . Cholecystectomy  1995  . Abdominal hysterectomy  1974  . Vaginal delivery      2, 1x forceps delivery  . Kidney stone surgery      Home Medications:    Medication List       This list is accurate as of: 03/03/15  8:54 PM.  Always use your most recent med list.               buPROPion 150 MG 12 hr tablet  Commonly known as:  WELLBUTRIN SR  Take 150 mg by mouth 2 (two) times daily.     diltiazem 180 MG 24 hr capsule  Commonly known as:  CARDIZEM CD  Take 1 capsule (180 mg total) by mouth daily.     fexofenadine 180 MG tablet  Commonly known as:  ALLEGRA  TAKE 1 TABLET BY MOUTH ONCE A DAY (OTC*)     fluticasone 50 MCG/ACT nasal spray  Commonly known as:  FLONASE  Place 2 sprays into the nose daily.     Fluticasone-Salmeterol 100-50 MCG/DOSE Aepb  Commonly known as:  ADVAIR  Inhale 1 puff into the lungs every 12 (twelve) hours.     gabapentin 300 MG capsule  Commonly known as:  NEURONTIN  TAKE 1 CAPSULE BY MOUTH 3 TIMES DAILY     lansoprazole 15 MG capsule  Commonly  known as:  PREVACID  Take 15 mg by mouth daily.     metoprolol succinate 25 MG 24 hr tablet  Commonly known as:  TOPROL-XL     montelukast 10 MG tablet  Commonly known as:  SINGULAIR  Take 10 mg by mouth at bedtime.     ONE TOUCH ULTRA TEST test strip  Generic drug:  glucose blood     potassium citrate 10 MEQ (1080 MG) SR tablet  Commonly known as:  UROCIT-K  Take 10 mEq by mouth 4 (four) times daily.     pravastatin 20 MG tablet  Commonly known as:  PRAVACHOL  Take 20 mg by mouth daily.     PROAIR HFA 108 (90 BASE) MCG/ACT inhaler  Generic drug:  albuterol  Inhale 2 puffs into the lungs every 6 (six) hours as needed for wheezing.     sertraline 100 MG tablet  Commonly known as:  ZOLOFT  Take 100 mg by mouth daily.      SYNTHROID 75 MCG tablet  Generic drug:  levothyroxine  1 BY MOUTH EVERY MORNING , DO NOT SUBSTITUTE PLEASE.Marland Kitchen REPLACESDISCONTINUED LEVOXYL     TRADJENTA 5 MG Tabs tablet  Generic drug:  linagliptin  Take 5 mg by mouth daily.     traZODone 50 MG tablet  Commonly known as:  DESYREL     Vitamin D (Ergocalciferol) 50000 UNITS Caps capsule  Commonly known as:  DRISDOL  Take 50,000 Units by mouth every 7 (seven) days.     zoledronic acid 5 MG/100ML Soln injection  Commonly known as:  RECLAST        Allergies:  Allergies  Allergen Reactions  . Shellfish Allergy Hives and Swelling  . Wheat Bran Other (See Comments)  . Iodinated Diagnostic Agents Rash    Family History: Family History  Problem Relation Age of Onset  . Arthritis Mother   . Hyperlipidemia Mother   . Hypertension Mother   . Kidney disease Mother   . Diabetes Mother   . Cancer Mother     colon  . Cancer Father     lung  . Bladder Cancer Neg Hx     Social History:  reports that she has never smoked. She has never used smokeless tobacco. She reports that she does not drink alcohol or use illicit drugs.  ROS: UROLOGY Frequent Urination?: Yes Hard to postpone urination?: Yes Burning/pain with urination?: No Get up at night to urinate?: Yes Leakage of urine?: No Urine stream starts and stops?: No Trouble starting stream?: No Do you have to strain to urinate?: No Blood in urine?: No Urinary tract infection?: No Sexually transmitted disease?: No Injury to kidneys or bladder?: No Painful intercourse?: No Weak stream?: No Currently pregnant?: No Vaginal bleeding?: No Last menstrual period?: n  Gastrointestinal Nausea?: No Vomiting?: No Indigestion/heartburn?: No Diarrhea?: No Constipation?: No  Constitutional Fever: No Night sweats?: No Weight loss?: No Fatigue?: No  Skin Skin rash/lesions?: Yes Itching?: Yes  Eyes Blurred vision?: Yes Double vision?: No  Ears/Nose/Throat Sore throat?:  No Sinus problems?: Yes  Hematologic/Lymphatic Swollen glands?: No Easy bruising?: No  Cardiovascular Leg swelling?: Yes Chest pain?: No  Respiratory Cough?: No Shortness of breath?: Yes  Endocrine Excessive thirst?: Yes  Musculoskeletal Back pain?: Yes Joint pain?: No  Neurological Headaches?: No Dizziness?: Yes  Psychologic Depression?: Yes Anxiety?: Yes  Physical Exam: BP 131/77 mmHg  Pulse 72  Ht 5\' 2"  (1.575 m)  Wt 187 lb 4.8 oz (84.959 kg)  BMI 34.25  kg/m2  GU:  Atrophic external genitalia.  Normal urethral meatus. No urethral masses and/or tenderness. No bladder fullness or masses. No vaginal lesions or discharge. Normal rectal tone, no masses. Normal anus and perineum.   Laboratory Data: Results for orders placed or performed in visit on 02/12/15  CULTURE, URINE COMPREHENSIVE  Result Value Ref Range   Urine Culture, Comprehensive Final report    Result 1 Comment   Microscopic Examination  Result Value Ref Range   WBC, UA >30W 0 -  5 /hpf   RBC, UA 3-10 (A) 0 -  2 /hpf   Epithelial Cells (non renal) 0-10 0 - 10 /hpf   Casts Present (A) None seen /lpf   Cast Type Hyaline casts N/A   Crystals Present (A) N/A   Crystal Type Calcium Oxalate N/A   Bacteria, UA Few None seen/Few  Urinalysis, Complete  Result Value Ref Range   Specific Gravity, UA 1.020 1.005 - 1.030   pH, UA 7.0 5.0 - 7.5   Color, UA Yellow Yellow   Appearance Ur Clear Clear   Leukocytes, UA 2+ (A) Negative   Protein, UA 1+ (A) Negative/Trace   Glucose, UA Negative Negative   Ketones, UA Negative Negative   RBC, UA Trace (A) Negative   Bilirubin, UA Negative Negative   Urobilinogen, Ur 0.2 0.2 - 1.0 mg/dL   Nitrite, UA Negative Negative   Microscopic Examination See below:   BLADDER SCAN AMB NON-IMAGING  Result Value Ref Range   Scan Result 30      Lab Results  Component Value Date   CREATININE 1.3* 10/09/2012      Lab Results  Component Value Date   HGBA1C 5.2  10/09/2012    Urinalysis    Component Value Date/Time   GLUCOSEU Negative 02/12/2015 1405   BILIRUBINUR Negative 02/12/2015 1405   NITRITE Negative 02/12/2015 1405   LEUKOCYTESUR 2+* 02/12/2015 1405    Pertinent Imaging:  Assessment & Plan:    1. Microscopic hematuria:   Patient had 3-10 RBCs per high-power field on her urinalysis on 02/12/2015.  Urine culture was negative from that day.  She has not had any gross hematuria.  Her cath UA was negative for microscopic hematuria today.  - Urinalysis, Complete  2.  Incontinence: Patient did not want a referral for a pessary fitting.  She did have some success with Toviaz in the past. She would like to retry that medication today. I have given her Toviaz 8 mg samples #28. She will return in 3 weeks' time for PVR and symptom recheck.   3. Atrophic vaginitis:   Patient was prescribed vaginal estrogen cream (Estrace) and instructed to apply 0.5mg  (pea-sized amount) just inside the vaginal introitus with a finger-tip on  Monday, Wednesday and Friday nights.  4. Kidney stones: Patient with stable right nephrolithiasis.     Return in about 3 weeks (around 03/24/2015) for PVR .  Zara Council, [facility] [address], [place], [place] [address] [phone]

## 2015-03-03 NOTE — Progress Notes (Signed)
In and Out Catheterization  Patient is present today for a I & O catheterization due to patient unable to void in urine cup. Patient was cleaned and prepped in a sterile fashion with betadine and a14FR cath with jelly on tip was inserted no complications were noted , 23ml of urine return was noted, urine was clear in color. A clean urine sample was collected for UA with micro. Bladder was drained  and catheter was removed with out difficulty.    Preformed by: Lyndee Hensen CMA

## 2015-03-04 LAB — MICROSCOPIC EXAMINATION
Bacteria, UA: NONE SEEN
Epithelial Cells (non renal): NONE SEEN /hpf (ref 0–10)
RBC, UA: NONE SEEN /hpf (ref 0–?)

## 2015-03-04 LAB — URINALYSIS, COMPLETE
BILIRUBIN UA: NEGATIVE
GLUCOSE, UA: NEGATIVE
KETONES UA: NEGATIVE
Nitrite, UA: NEGATIVE
Urobilinogen, Ur: 0.2 mg/dL (ref 0.2–1.0)
pH, UA: 6 (ref 5.0–7.5)

## 2015-03-11 ENCOUNTER — Other Ambulatory Visit: Payer: Self-pay | Admitting: Podiatry

## 2015-03-11 NOTE — Telephone Encounter (Signed)
Refill Gabapentin 300mg  #90 1 tid.  I reviewed pt's dictation, and pt has not been evaluated in over 3 years in this office.

## 2015-03-11 NOTE — Telephone Encounter (Signed)
Ok to refill and sig. As previous.  Add refills.

## 2015-03-12 NOTE — Telephone Encounter (Signed)
Dr. Milinda Pointer states refill Gabapentin.

## 2015-03-24 ENCOUNTER — Encounter: Payer: Self-pay | Admitting: Urology

## 2015-03-24 ENCOUNTER — Ambulatory Visit (INDEPENDENT_AMBULATORY_CARE_PROVIDER_SITE_OTHER): Payer: Medicare Other | Admitting: Urology

## 2015-03-24 VITALS — BP 115/80 | HR 72 | Resp 16 | Ht 63.0 in | Wt 181.5 lb

## 2015-03-24 DIAGNOSIS — N952 Postmenopausal atrophic vaginitis: Secondary | ICD-10-CM

## 2015-03-24 DIAGNOSIS — R32 Unspecified urinary incontinence: Secondary | ICD-10-CM | POA: Diagnosis not present

## 2015-03-24 DIAGNOSIS — R3129 Other microscopic hematuria: Secondary | ICD-10-CM

## 2015-03-24 DIAGNOSIS — R312 Other microscopic hematuria: Secondary | ICD-10-CM | POA: Diagnosis not present

## 2015-03-24 DIAGNOSIS — N200 Calculus of kidney: Secondary | ICD-10-CM | POA: Diagnosis not present

## 2015-03-24 LAB — MICROSCOPIC EXAMINATION: RBC MICROSCOPIC, UA: NONE SEEN /HPF (ref 0–?)

## 2015-03-24 LAB — BLADDER SCAN AMB NON-IMAGING

## 2015-03-24 LAB — URINALYSIS, COMPLETE
BILIRUBIN UA: NEGATIVE
Glucose, UA: NEGATIVE
Ketones, UA: NEGATIVE
NITRITE UA: NEGATIVE
Specific Gravity, UA: 1.015 (ref 1.005–1.030)
Urobilinogen, Ur: 0.2 mg/dL (ref 0.2–1.0)
pH, UA: 6 (ref 5.0–7.5)

## 2015-03-24 NOTE — Progress Notes (Signed)
03/24/2015 8:44 PM   Brianna Cole Mar 04, 1941 [id number]  Referring provider: Lenard Simmer, MD [address] [place], [place] [address]  Chief Complaint  Patient presents with  . Hematuria    HPI: Patient is a 74 year old white female who is started on Toviaz 8 mg 1 daily for her urinary incontinence who presents today for a 3 week follow-up.  She states that she finds the Toviaz effectively controlling her incontinence, but she is experiencing extreme dry mouth. It is very irritating to her.  She had tried Myrbetriq in the past but, she had an episode of urinary retention and discontinued the medication.  She likes the control of incontinence with the Toviaz, but she is disappointed in the side effect of dry mouth.  She is wondering if there is another medication she could try for the incontinence.  She has not had any gross hematuria, dysuria or suprapubic pain. Her UA today is unremarkable. Her PVR is 113 mL.     PMH: Past Medical History  Diagnosis Date  . Arthritis   . Asthma   . Depression   . Diabetes mellitus without complication   . GERD (gastroesophageal reflux disease)   . Hypertension   . Hyperlipidemia   . Chronic kidney disease   . Thyroid disease   . Kidney stones   . Cataract   . Environmental allergies     Followed by Dr. Donneta Romberg  . Urinary incontinence     Surgical History: Past Surgical History  Procedure Laterality Date  . Cholecystectomy  1995  . Abdominal hysterectomy  1974  . Vaginal delivery      2, 1x forceps delivery  . Kidney stone surgery      Home Medications:    Medication List       This list is accurate as of: 03/24/15 11:59 PM.  Always use your most recent med list.               buPROPion 150 MG 12 hr tablet  Commonly known as:  WELLBUTRIN SR  Take 150 mg by mouth 2 (two) times daily.     diltiazem 180 MG 24 hr capsule  Commonly known as:  CARDIZEM CD  Take 1 capsule (180 mg total) by mouth daily.     estradiol  0.1 MG/GM vaginal cream  Commonly known as:  ESTRACE VAGINAL  Place 1 Applicatorful vaginally at bedtime.     fexofenadine 180 MG tablet  Commonly known as:  ALLEGRA  TAKE 1 TABLET BY MOUTH ONCE A DAY (OTC*)     fluticasone 50 MCG/ACT nasal spray  Commonly known as:  FLONASE  Place 2 sprays into the nose daily.     Fluticasone-Salmeterol 100-50 MCG/DOSE Aepb  Commonly known as:  ADVAIR  Inhale 1 puff into the lungs every 12 (twelve) hours.     gabapentin 300 MG capsule  Commonly known as:  NEURONTIN  TAKE 1 CAPSULE 3 TIMES DAILY     lansoprazole 15 MG capsule  Commonly known as:  PREVACID  Take 15 mg by mouth daily.     metoprolol succinate 25 MG 24 hr tablet  Commonly known as:  TOPROL-XL     montelukast 10 MG tablet  Commonly known as:  SINGULAIR  Take 10 mg by mouth at bedtime.     ONE TOUCH ULTRA TEST test strip  Generic drug:  glucose blood     potassium citrate 10 MEQ (1080 MG) SR tablet  Commonly known as:  UROCIT-K  Take 10 mEq by mouth 4 (four) times daily.     pravastatin 20 MG tablet  Commonly known as:  PRAVACHOL  Take 20 mg by mouth daily.     PROAIR HFA 108 (90 BASE) MCG/ACT inhaler  Generic drug:  albuterol  Inhale 2 puffs into the lungs every 6 (six) hours as needed for wheezing.     sertraline 100 MG tablet  Commonly known as:  ZOLOFT  Take 100 mg by mouth daily.     SYNTHROID 75 MCG tablet  Generic drug:  levothyroxine  1 BY MOUTH EVERY MORNING , DO NOT SUBSTITUTE PLEASE.Marland Kitchen REPLACESDISCONTINUED LEVOXYL     TRADJENTA 5 MG Tabs tablet  Generic drug:  linagliptin  Take 5 mg by mouth daily.     traZODone 50 MG tablet  Commonly known as:  DESYREL     Vitamin D (Ergocalciferol) 50000 UNITS Caps capsule  Commonly known as:  DRISDOL  Take 50,000 Units by mouth every 7 (seven) days.     zoledronic acid 5 MG/100ML Soln injection  Commonly known as:  RECLAST        Allergies:  Allergies  Allergen Reactions  . Shellfish Allergy Hives and  Swelling  . Wheat Bran Other (See Comments)  . Iodinated Diagnostic Agents Rash    Family History: Family History  Problem Relation Age of Onset  . Arthritis Mother   . Hyperlipidemia Mother   . Hypertension Mother   . Kidney disease Mother   . Diabetes Mother   . Cancer Mother     colon  . Cancer Father     lung  . Bladder Cancer Neg Hx     Social History:  reports that she has never smoked. She has never used smokeless tobacco. She reports that she does not drink alcohol or use illicit drugs.  ROS: UROLOGY Frequent Urination?: No Hard to postpone urination?: No Burning/pain with urination?: No Get up at night to urinate?: Yes Leakage of urine?: Yes Urine stream starts and stops?: Yes Trouble starting stream?: No Do you have to strain to urinate?: No Blood in urine?: No Urinary tract infection?: No Sexually transmitted disease?: No Injury to kidneys or bladder?: No Painful intercourse?: No Weak stream?: No Currently pregnant?: No Vaginal bleeding?: No Last menstrual period?: n  Gastrointestinal Nausea?: No Vomiting?: No Indigestion/heartburn?: No Diarrhea?: No Constipation?: Yes  Constitutional Fever: No Night sweats?: No Weight loss?: No Fatigue?: No  Skin Skin rash/lesions?: No Itching?: No  Eyes Blurred vision?: No Double vision?: No  Ears/Nose/Throat Sore throat?: No Sinus problems?: No  Hematologic/Lymphatic Swollen glands?: No Easy bruising?: No  Cardiovascular Leg swelling?: No Chest pain?: No  Respiratory Cough?: No Shortness of breath?: Yes  Endocrine Excessive thirst?: Yes  Musculoskeletal Back pain?: Yes Joint pain?: No  Neurological Headaches?: No Dizziness?: Yes  Psychologic Depression?: No Anxiety?: No  Physical Exam: BP 115/80 mmHg  Pulse 72  Resp 16  Ht 5\' 3"  (1.6 m)  Wt 181 lb 8 oz (82.328 kg)  BMI 32.16 kg/m2   Laboratory Data:  Lab Results  Component Value Date   CREATININE 1.3* 10/09/2012    Lab Results  Component Value Date   HGBA1C 5.2 10/09/2012   Urinalysis:  Results for orders placed or performed in visit on 03/24/15  Microscopic Examination  Result Value Ref Range   WBC, UA 11-30 (A) 0 -  5 /hpf   RBC, UA None seen 0 -  2 /hpf   Epithelial Cells (non renal)  0-10 0 - 10 /hpf   Crystals Present (A) N/A   Crystal Type Calcium Oxalate N/A   Mucus, UA Present (A) Not Estab.   Bacteria, UA Many (A) None seen/Few  Urinalysis, Complete  Result Value Ref Range   Specific Gravity, UA 1.015 1.005 - 1.030   pH, UA 6.0 5.0 - 7.5   Color, UA Yellow Yellow   Appearance Ur Clear Clear   Leukocytes, UA 2+ (A) Negative   Protein, UA 1+ (A) Negative/Trace   Glucose, UA Negative Negative   Ketones, UA Negative Negative   RBC, UA Trace (A) Negative   Bilirubin, UA Negative Negative   Urobilinogen, Ur 0.2 0.2 - 1.0 mg/dL   Nitrite, UA Negative Negative   Microscopic Examination See below:    Pertinent Imaging: BLADDER SCAN AMB NON-IMAGING  Result Value Ref Range   Scan Result 139ml      Assessment & Plan:    1. Incontinence: Patient did not want a referral for a pessary fitting. Patient found the Toviaz effective, but the side effects she found intolerable. She wants to try a different medication. I explained to her that all anticholinergics have the potential side effects of dry mouth. She would like to try the Myrbetriq again since it to was effective in controlling her incontinence. She did have an episode of urinary retention with the Myrbetriq and she is aware of the risk. I have given her Myrbetriq 25 mg samples #28. She will return in 3 weeks' time for PVR and symptom recheck. She is to discontinue the medication if she should have difficulty urinating and contact our office.  - BLADDER SCAN AMB NON-IMAGING  2. Microscopic hematuria: Patient had 3-10 RBCs per high-power field on her urinalysis on 02/12/2015. Urine culture was negative from that day. She has  not had any gross hematuria. Her cath UA was negative for microscopic hematuria today.  - Urinalysis, Complete  3. Atrophic vaginitis: Patient was prescribed vaginal estrogen cream (Estrace) and instructed to apply 0.5mg  (pea-sized amount) just inside the vaginal introitus with a finger-tip on Monday, Wednesday and Friday nights.  4. Kidney stones: Patient with stable right nephrolithiasis.  KUB completed on 02/12/2015.  We will continue to monitor with yearly KUBs.  Return in about 3 weeks (around 04/14/2015) for PVR.  Zara Council, [facility] [address] [place], [place] [address] [phone]

## 2015-04-14 ENCOUNTER — Ambulatory Visit: Payer: Medicare Other | Admitting: Urology

## 2015-04-14 ENCOUNTER — Encounter: Payer: Self-pay | Admitting: Urology

## 2015-05-06 ENCOUNTER — Ambulatory Visit: Payer: Medicare Other | Admitting: Urology

## 2015-05-06 ENCOUNTER — Encounter: Payer: Self-pay | Admitting: Urology

## 2015-05-13 ENCOUNTER — Encounter: Payer: Self-pay | Admitting: Urology

## 2015-05-13 ENCOUNTER — Ambulatory Visit (INDEPENDENT_AMBULATORY_CARE_PROVIDER_SITE_OTHER): Payer: Medicare Other | Admitting: Urology

## 2015-05-13 ENCOUNTER — Ambulatory Visit
Admission: RE | Admit: 2015-05-13 | Discharge: 2015-05-13 | Disposition: A | Discharge status: Home / Self Care | Payer: Medicare Other | Source: Ambulatory Visit | Attending: Urology | Admitting: Urology

## 2015-05-13 VITALS — BP 108/73 | HR 84 | Resp 16 | Ht 63.0 in | Wt 180.2 lb

## 2015-05-13 DIAGNOSIS — N200 Calculus of kidney: Secondary | ICD-10-CM | POA: Insufficient documentation

## 2015-05-13 DIAGNOSIS — R3129 Other microscopic hematuria: Secondary | ICD-10-CM

## 2015-05-13 DIAGNOSIS — N952 Postmenopausal atrophic vaginitis: Secondary | ICD-10-CM | POA: Diagnosis not present

## 2015-05-13 DIAGNOSIS — R32 Unspecified urinary incontinence: Secondary | ICD-10-CM | POA: Diagnosis not present

## 2015-05-13 LAB — MICROSCOPIC EXAMINATION: RENAL EPITHEL UA: NONE SEEN /HPF

## 2015-05-13 LAB — URINALYSIS, COMPLETE
Bilirubin, UA: NEGATIVE
Glucose, UA: NEGATIVE
Ketones, UA: NEGATIVE
NITRITE UA: NEGATIVE
PH UA: 6 (ref 5.0–7.5)
Protein, UA: NEGATIVE
Specific Gravity, UA: 1.01 (ref 1.005–1.030)
Urobilinogen, Ur: 0.2 mg/dL (ref 0.2–1.0)

## 2015-05-13 LAB — BLADDER SCAN AMB NON-IMAGING: Scan Result: 30

## 2015-05-13 MED ORDER — MIRABEGRON ER 25 MG PO TB24: 25.0000 mg | ORAL_TABLET | Freq: Every day | ORAL | Status: DC

## 2015-05-13 NOTE — Progress Notes (Signed)
05/13/2015 2:19 PM   Brianna Cole Dec 03, 1940 [id number]  Referring provider: Lenard Simmer, MD [address] [place], [place] [address]  Chief Complaint  Patient presents with  . Urinary Incontinence    HPI: Patient is 74 year old white female with urinary incontinence who was tried on Myrbetriq 25 mg daily and presents today to discuss symptoms and PVR recheck.    Patient did not have a rise in her blood pressure was taken the medication. She also did not have an incident of urinary retention. She states she feels she is able to void freely and without discomfort.  She is no longer having to run into the restroom. She states the incontinence has been reduced by more than half.  She'll like to continue the medication.  She is not experiencing any gross hematuria, dysuria or suprapubic pain.  She is also not having any fevers, chills, nausea or vomiting.   PMH: Past Medical History  Diagnosis Date  . Arthritis   . Asthma   . Depression   . Diabetes mellitus without complication ([place])   . GERD (gastroesophageal reflux disease)   . Hypertension   . Hyperlipidemia   . Chronic kidney disease   . Thyroid disease   . Kidney stones   . Cataract   . Environmental allergies     Followed by Dr. Donneta Romberg  . Urinary incontinence     Surgical History: Past Surgical History  Procedure Laterality Date  . Cholecystectomy  1995  . Abdominal hysterectomy  1974  . Vaginal delivery      2, 1x forceps delivery  . Kidney stone surgery      Home Medications:    Medication List       This list is accurate as of: 05/13/15  2:19 PM.  Always use your most recent med list.               buPROPion 150 MG 12 hr tablet  Commonly known as:  WELLBUTRIN SR  Take 150 mg by mouth 2 (two) times daily.     diltiazem 180 MG 24 hr capsule  Commonly known as:  CARDIZEM CD  Take 1 capsule (180 mg total) by mouth daily.     estradiol 0.1 MG/GM vaginal cream  Commonly known as:  ESTRACE  VAGINAL  Place 1 Applicatorful vaginally at bedtime.     fexofenadine 180 MG tablet  Commonly known as:  ALLEGRA  TAKE 1 TABLET BY MOUTH ONCE A DAY (OTC*)     fluticasone 50 MCG/ACT nasal spray  Commonly known as:  FLONASE  Place 2 sprays into the nose daily.     Fluticasone-Salmeterol 100-50 MCG/DOSE Aepb  Commonly known as:  ADVAIR  Inhale 1 puff into the lungs every 12 (twelve) hours.     gabapentin 300 MG capsule  Commonly known as:  NEURONTIN  TAKE 1 CAPSULE 3 TIMES DAILY     lansoprazole 15 MG capsule  Commonly known as:  PREVACID  Take 15 mg by mouth daily.     metoprolol succinate 25 MG 24 hr tablet  Commonly known as:  TOPROL-XL     mirabegron ER 25 MG Tb24 tablet  Commonly known as:  MYRBETRIQ  Take 1 tablet (25 mg total) by mouth daily.     montelukast 10 MG tablet  Commonly known as:  SINGULAIR  Take 10 mg by mouth at bedtime.     ONE TOUCH ULTRA TEST test strip  Generic drug:  glucose blood  pravastatin 20 MG tablet  Commonly known as:  PRAVACHOL  Take 20 mg by mouth daily.     PROAIR HFA 108 (90 BASE) MCG/ACT inhaler  Generic drug:  albuterol  Inhale 2 puffs into the lungs every 6 (six) hours as needed for wheezing.     sertraline 100 MG tablet  Commonly known as:  ZOLOFT  Take 100 mg by mouth daily.     SYNTHROID 75 MCG tablet  Generic drug:  levothyroxine  1 BY MOUTH EVERY MORNING , DO NOT SUBSTITUTE PLEASE.Marland Kitchen REPLACESDISCONTINUED LEVOXYL     TRADJENTA 5 MG Tabs tablet  Generic drug:  linagliptin  Take 5 mg by mouth daily.     traZODone 50 MG tablet  Commonly known as:  DESYREL     Vitamin D (Ergocalciferol) 50000 UNITS Caps capsule  Commonly known as:  DRISDOL  Take 50,000 Units by mouth every 7 (seven) days.     zoledronic acid 5 MG/100ML Soln injection  Commonly known as:  RECLAST        Allergies:  Allergies  Allergen Reactions  . Shellfish Allergy Hives and Swelling  . Wheat Bran Other (See Comments)  . Iodinated  Diagnostic Agents Rash    Family History: Family History  Problem Relation Age of Onset  . Arthritis Mother   . Hyperlipidemia Mother   . Hypertension Mother   . Kidney disease Mother   . Diabetes Mother   . Cancer Mother     colon  . Cancer Father     lung  . Bladder Cancer Neg Hx     Social History:  reports that she has never smoked. She has never used smokeless tobacco. She reports that she does not drink alcohol or use illicit drugs.  ROS: UROLOGY Frequent Urination?: No Hard to postpone urination?: Yes Burning/pain with urination?: No Get up at night to urinate?: Yes Leakage of urine?: Yes Urine stream starts and stops?: No Trouble starting stream?: No Do you have to strain to urinate?: No Blood in urine?: No Urinary tract infection?: No Sexually transmitted disease?: No Injury to kidneys or bladder?: Yes Painful intercourse?: No Weak stream?: Yes Currently pregnant?: No Vaginal bleeding?: No Last menstrual period?: n  Gastrointestinal Nausea?: No Vomiting?: No Indigestion/heartburn?: Yes Diarrhea?: No Constipation?: No  Constitutional Fever: No Night sweats?: No Weight loss?: No Fatigue?: No  Skin Skin rash/lesions?: No Itching?: No  Eyes Blurred vision?: Yes Double vision?: No  Ears/Nose/Throat Sore throat?: No Sinus problems?: Yes  Hematologic/Lymphatic Swollen glands?: No Easy bruising?: Yes  Cardiovascular Leg swelling?: No Chest pain?: No  Respiratory Cough?: No Shortness of breath?: No  Endocrine Excessive thirst?: Yes  Musculoskeletal Back pain?: Yes Joint pain?: No  Neurological Headaches?: No Dizziness?: Yes  Psychologic Depression?: Yes Anxiety?: No  Physical Exam: BP 108/73 mmHg  Pulse 84  Resp 16  Ht 5\' 3"  (1.6 m)  Wt 180 lb 3.2 oz (81.738 kg)  BMI 31.93 kg/m2   Laboratory Data:  Lab Results  Component Value Date   CREATININE 1.3* 10/09/2012    Lab Results  Component Value Date   HGBA1C 5.2  10/09/2012    Urinalysis Results for orders placed or performed in visit on 05/13/15  Microscopic Examination  Result Value Ref Range   WBC, UA >30 (A) 0 -  5 /hpf   RBC, UA 0-2 0 -  2 /hpf   Epithelial Cells (non renal) 0-10 0 - 10 /hpf   Renal Epithel, UA None seen None seen /hpf  Bacteria, UA Moderate (A) None seen/Few  Urinalysis, Complete  Result Value Ref Range   Specific Gravity, UA 1.010 1.005 - 1.030   pH, UA 6.0 5.0 - 7.5   Color, UA Yellow Yellow   Appearance Ur Clear Clear   Leukocytes, UA 2+ (A) Negative   Protein, UA Negative Negative/Trace   Glucose, UA Negative Negative   Ketones, UA Negative Negative   RBC, UA Trace (A) Negative   Bilirubin, UA Negative Negative   Urobilinogen, Ur 0.2 0.2 - 1.0 mg/dL   Nitrite, UA Negative Negative   Microscopic Examination See below:   BLADDER SCAN AMB NON-IMAGING  Result Value Ref Range   Scan Result 30 mL    Pertinent imaging Results for FATUMA, DOWERS (MRN [id number]) as of 05/13/2015 14:26  Ref. Range 05/13/2015 13:47  Scan Result Unknown 30 mL    Assessment & Plan:   1. Incontinence:   Patient will continue Myrbetriq 25 mg daily.  I have sent a refill to her pharmacy.  She will return in 1 year for symptom recheck and PVR.  - BLADDER SCAN AMB NON-IMAGING  2. Microscopic hematuria: We will continue to monitor her UA's on a yearly basis.  She will report to [place] if she should experience any gross hematuria.   - Urinalysis, Complete  3. Atrophic vaginitis: Patient will use the vaginal estrogen cream on a as needed basis.    4. Kidney stones: Patient with stable right nephrolithiasis. KUB completed on 02/12/2015. We will continue to monitor with yearly KUBs.  Return in about 1 year (around 05/12/2016) for KUB, UA and KUB.  Zara Council, [facility] [address] [place], [place] [address] [phone]

## 2015-10-16 ENCOUNTER — Telehealth: Payer: Self-pay | Admitting: *Deleted

## 2015-10-16 MED ORDER — GABAPENTIN 300 MG PO CAPS: 300.0000 mg | ORAL_CAPSULE | Freq: Three times a day (TID) | ORAL | Status: DC

## 2015-10-16 NOTE — Telephone Encounter (Signed)
Fax refill request for Gabapentin 300mg  from [facility].  Dr. Milinda Pointer ordered refill for 1 year. Done.

## 2015-10-29 ENCOUNTER — Other Ambulatory Visit: Payer: Self-pay

## 2015-10-29 DIAGNOSIS — R32 Unspecified urinary incontinence: Secondary | ICD-10-CM

## 2015-10-29 MED ORDER — MIRABEGRON ER 25 MG PO TB24: 25.0000 mg | ORAL_TABLET | Freq: Every day | ORAL | Status: DC

## 2016-05-12 ENCOUNTER — Other Ambulatory Visit: Payer: Self-pay | Admitting: Urology

## 2016-05-12 ENCOUNTER — Ambulatory Visit
Admission: RE | Admit: 2016-05-12 | Discharge: 2016-05-12 | Disposition: A | Discharge status: Home / Self Care | Payer: Medicare Other | Source: Ambulatory Visit | Attending: Urology | Admitting: Urology

## 2016-05-12 ENCOUNTER — Ambulatory Visit: Payer: Medicare Other | Admitting: Urology

## 2016-05-12 VITALS — BP 154/89 | HR 72 | Ht 63.0 in

## 2016-05-12 DIAGNOSIS — Z87448 Personal history of other diseases of urinary system: Secondary | ICD-10-CM | POA: Diagnosis not present

## 2016-05-12 DIAGNOSIS — N3941 Urge incontinence: Secondary | ICD-10-CM | POA: Diagnosis not present

## 2016-05-12 DIAGNOSIS — N200 Calculus of kidney: Secondary | ICD-10-CM

## 2016-05-12 DIAGNOSIS — N952 Postmenopausal atrophic vaginitis: Secondary | ICD-10-CM | POA: Diagnosis not present

## 2016-05-12 LAB — BLADDER SCAN AMB NON-IMAGING: Scan Result: 0

## 2016-05-12 LAB — MICROSCOPIC EXAMINATION

## 2016-05-12 LAB — URINALYSIS, COMPLETE
Bilirubin, UA: NEGATIVE
GLUCOSE, UA: NEGATIVE
Ketones, UA: NEGATIVE
NITRITE UA: NEGATIVE
Specific Gravity, UA: 1.015 (ref 1.005–1.030)
Urobilinogen, Ur: 0.2 mg/dL (ref 0.2–1.0)
pH, UA: 6.5 (ref 5.0–7.5)

## 2016-05-12 MED ORDER — ESTRADIOL 0.1 MG/GM VA CREA: TOPICAL_CREAM | VAGINAL | 12 refills | Status: DC

## 2016-05-12 MED ORDER — MIRABEGRON ER 25 MG PO TB24: 25.0000 mg | ORAL_TABLET | Freq: Every day | ORAL | 12 refills | Status: DC

## 2016-05-12 MED ORDER — ESTROGENS, CONJUGATED 0.625 MG/GM VA CREA: 1.0000 | TOPICAL_CREAM | Freq: Every day | VAGINAL | 12 refills | Status: DC

## 2016-05-12 NOTE — Progress Notes (Signed)
05/12/2016 3:03 PM   Brianna Cole 05-Feb-1941 [id number]  Referring provider: Lenard Simmer, MD [address] [place], [place] [address]  Chief Complaint  Patient presents with  . Hematuria    HPI: Patient is 75 year old Caucasian female with urinary incontinence, history of hematuria, atrophic vaginitis and a history of nephrolithiasis who presents today for yearly follow-up.  Urinary incontinence Patient currently on Myrbetriq 25 mg daily.  She is having a great deal of bother with accidental loss of small amounts of urine, daytime urination, waking up at night because she has to urinate and urine loss associated with a strong desire to urinate. She has quite a bit of bother with the sudden urge to urinate was little or no warning.  She is not bothered at all with uncomfortable urge to urinate.  This is indicated by the [facility]AB short form questionnaire.  Her PVR today is 0 mL.  Her UA is significant for greater than 30 WBC's/hpf and 3-10RBC's/hpf.    History of hematuria She denies any gross hematuria.  3-10RBC's/hpf.  Urine sent for culture.  Patient has bilateral nephrolithiasis.    Vaginal atrophy Patient using the vaginal estrogen cream intermittently.  History of nephrolithiasis Patient denies any flank pain, passage of fragments or gross hematuria. KUB taken today demonstrates stable bilateral renal calculi.  I have independently reviewed the films.      PMH: Past Medical History:  Diagnosis Date  . Arthritis   . Asthma   . Cataract   . Chronic kidney disease   . Depression   . Diabetes mellitus without complication ([place])   . Environmental allergies    Followed by Dr. Donneta Romberg  . GERD (gastroesophageal reflux disease)   . Hyperlipidemia   . Hypertension   . Kidney stones   . Thyroid disease   . Urinary incontinence     Surgical History: Past Surgical History:  Procedure Laterality Date  . ABDOMINAL HYSTERECTOMY  1974  . CHOLECYSTECTOMY  1995  . KIDNEY  STONE SURGERY    . VAGINAL DELIVERY     2, 1x forceps delivery    Home Medications:    Medication List       Accurate as of 05/12/16  3:03 PM. Always use your most recent med list.          buPROPion 150 MG 12 hr tablet Commonly known as:  WELLBUTRIN SR Take 150 mg by mouth 2 (two) times daily.   conjugated estrogens vaginal cream Commonly known as:  PREMARIN Place 1 Applicatorful vaginally daily. Apply 0.5mg  (pea-sized amount)  just inside the vaginal introitus with a finger-tip every night for two weeks and then Monday, Wednesday and Friday nights.   diltiazem 180 MG 24 hr capsule Commonly known as:  CARDIZEM CD Take 1 capsule (180 mg total) by mouth daily.   estradiol 0.1 MG/GM vaginal cream Commonly known as:  ESTRACE VAGINAL Place 1 Applicatorful vaginally at bedtime.   estradiol 0.1 MG/GM vaginal cream Commonly known as:  ESTRACE VAGINAL Apply 0.5mg  (pea-sized amount)  just inside the vaginal introitus with a finger-tip every night for two weeks and then Monday, Wednesday and Friday nights.   fexofenadine 180 MG tablet Commonly known as:  ALLEGRA TAKE 1 TABLET BY MOUTH ONCE A DAY (OTC*)   fluticasone 50 MCG/ACT nasal spray Commonly known as:  FLONASE Place 2 sprays into the nose daily.   Fluticasone-Salmeterol 100-50 MCG/DOSE Aepb Commonly known as:  ADVAIR Inhale 1 puff into the lungs every 12 (twelve)  hours.   gabapentin 300 MG capsule Commonly known as:  NEURONTIN Take 1 capsule (300 mg total) by mouth 3 (three) times daily.   lansoprazole 15 MG capsule Commonly known as:  PREVACID Take 15 mg by mouth daily.   metoprolol succinate 25 MG 24 hr tablet Commonly known as:  TOPROL-XL   mirabegron ER 25 MG Tb24 tablet Commonly known as:  MYRBETRIQ Take 1 tablet (25 mg total) by mouth daily.   montelukast 10 MG tablet Commonly known as:  SINGULAIR Take 10 mg by mouth at bedtime.   ONE TOUCH ULTRA TEST test strip Generic drug:  glucose blood     pravastatin 20 MG tablet Commonly known as:  PRAVACHOL Take 20 mg by mouth daily.   PROAIR HFA 108 (90 Base) MCG/ACT inhaler Generic drug:  albuterol Inhale 2 puffs into the lungs every 6 (six) hours as needed for wheezing.   sertraline 100 MG tablet Commonly known as:  ZOLOFT Take 100 mg by mouth daily.   SYNTHROID 75 MCG tablet Generic drug:  levothyroxine 1 BY MOUTH EVERY MORNING , DO NOT SUBSTITUTE PLEASE.Marland Kitchen REPLACESDISCONTINUED LEVOXYL   TRADJENTA 5 MG Tabs tablet Generic drug:  linagliptin Take 5 mg by mouth daily.   traZODone 50 MG tablet Commonly known as:  DESYREL   Vitamin D (Ergocalciferol) 50000 units Caps capsule Commonly known as:  DRISDOL Take 50,000 Units by mouth every 7 (seven) days.   zoledronic acid 5 MG/100ML Soln injection Commonly known as:  RECLAST       Allergies:  Allergies  Allergen Reactions  . Shellfish Allergy Hives and Swelling  . Wheat Bran Other (See Comments)  . Iodinated Diagnostic Agents Rash    Family History: Family History  Problem Relation Age of Onset  . Arthritis Mother   . Hyperlipidemia Mother   . Hypertension Mother   . Kidney disease Mother   . Diabetes Mother   . Cancer Mother     colon  . Cancer Father     lung  . Bladder Cancer Neg Hx     Social History:  reports that she has never smoked. She has never used smokeless tobacco. She reports that she does not drink alcohol or use drugs.  ROS: UROLOGY Frequent Urination?: No Hard to postpone urination?: Yes Burning/pain with urination?: No Get up at night to urinate?: Yes Leakage of urine?: Yes Urine stream starts and stops?: No Trouble starting stream?: No Do you have to strain to urinate?: No Blood in urine?: No Urinary tract infection?: No Sexually transmitted disease?: No Injury to kidneys or bladder?: No Painful intercourse?: No Weak stream?: No Currently pregnant?: No Vaginal bleeding?: No Last menstrual period?:  n  Gastrointestinal Nausea?: No Vomiting?: No Indigestion/heartburn?: Yes Diarrhea?: No Constipation?: No  Constitutional Fever: No Night sweats?: No Weight loss?: No Fatigue?: No  Skin Skin rash/lesions?: No Itching?: No  Eyes Blurred vision?: No Double vision?: No  Ears/Nose/Throat Sore throat?: No Sinus problems?: No  Hematologic/Lymphatic Swollen glands?: No Easy bruising?: No  Cardiovascular Leg swelling?: No Chest pain?: No  Respiratory Cough?: No Shortness of breath?: No  Endocrine Excessive thirst?: No  Musculoskeletal Back pain?: No Joint pain?: No  Neurological Headaches?: No Dizziness?: Yes  Psychologic Depression?: Yes Anxiety?: No  Physical Exam: BP (!) 154/89 (BP Location: Right Arm, Patient Position: Sitting, Cuff Size: Large)   Pulse 72   Ht 5\' 3"  (1.6 m)   Constitutional: Well nourished. Alert and oriented, No acute distress. HEENT: [place] AT, moist mucus  membranes. Trachea midline, no masses. Cardiovascular: No clubbing, cyanosis, or edema. Respiratory: Normal respiratory effort, no increased work of breathing. GI: Abdomen is soft, non tender, non distended, no abdominal masses. Liver and spleen not palpable.  No hernias appreciated.  Stool sample for occult testing is not indicated.   GU: No CVA tenderness.  No bladder fullness or masses.  Atrophic external genitalia, normal pubic hair distribution, no lesions.  Normal urethral meatus, no lesions, no prolapse, no discharge.   No urethral masses, tenderness and/or tenderness. No bladder fullness, tenderness or masses. Pale vagina mucosa, poor estrogen effect, no discharge, no lesions, poor pelvic support, Grade II cystocele is noted.  No rectocele noted.  Cervix, uterus and adnexa are surgically absent.   Anus and perineum are without rashes or lesions.    Skin: No rashes, bruises or suspicious lesions. Lymph: No cervical or inguinal adenopathy. Neurologic: Grossly intact, no focal  deficits, moving all 4 extremities. Psychiatric: Normal mood and affect.  Laboratory Data:  Lab Results  Component Value Date   CREATININE 1.3 (H) 10/09/2012    Lab Results  Component Value Date   HGBA1C 5.2 10/09/2012    Urinalysis > 30 WBC's.  3-10 RBC's/hpf.  See EPIC.  Pertinent imaging Results for Brianna Cole, Brianna Cole (MRN [id number]) as of 05/13/2015 14:26  Ref. Range 05/13/2015 13:47  Scan Result Unknown 30 mL   CLINICAL DATA:  Right-sided renal calculus  EXAM: ABDOMEN - 1 VIEW  COMPARISON:  05/13/2015  FINDINGS: Right-sided renal calculi are identified. The largest of these measures approximately 12 mm appears to lie adjacent to a slightly smaller stone. Additionally a lower pole 7 mm stone is noted and stable. Tiny fleck like left calcification is noted consistent with a nonobstructing stone. No ureteral calculi are seen. Stable scoliosis of the lumbar spine concave to the right is noted.  IMPRESSION: Stable bilateral renal calculi.   Electronically Signed   By: Inez Catalina M.D.   On: 05/12/2016 13:53  Assessment & Plan:   1. Incontinence:   Patient will continue Myrbetriq 25 mg daily.  I have sent a refill to her pharmacy.  She will return in 1 year for symptom recheck and PVR.  - BLADDER SCAN AMB NON-IMAGING  2. Microscopic hematuria  - 3-10 RBC's/hpf on today's exam  - may be due to vaginal atrophy  - Urinalysis, Complete  - recheck UA in 3 months  3. Atrophic vaginitis  - Patient will apply the vaginal estrogen cream 3 nights weekly  - She will return in 3 months for exam  4. Kidney stones: Patient with stable right nephrolithiasis. Possible punctate left renal stone.  KUB completed on 05/12/2016. We will continue to monitor with yearly KUBs.  Return in about 3 months (around 08/12/2016) for exam and UA.  Zara Council, [facility] [address] [place], [place] [address] [phone]

## 2016-05-16 LAB — CULTURE, URINE COMPREHENSIVE

## 2016-05-23 ENCOUNTER — Encounter: Payer: Self-pay | Admitting: Urology

## 2016-07-13 DIAGNOSIS — J301 Allergic rhinitis due to pollen: Secondary | ICD-10-CM | POA: Diagnosis not present

## 2016-07-13 DIAGNOSIS — J3081 Allergic rhinitis due to animal (cat) (dog) hair and dander: Secondary | ICD-10-CM | POA: Diagnosis not present

## 2016-07-13 DIAGNOSIS — J3089 Other allergic rhinitis: Secondary | ICD-10-CM | POA: Diagnosis not present

## 2016-07-22 DIAGNOSIS — J301 Allergic rhinitis due to pollen: Secondary | ICD-10-CM | POA: Diagnosis not present

## 2016-07-22 DIAGNOSIS — J3081 Allergic rhinitis due to animal (cat) (dog) hair and dander: Secondary | ICD-10-CM | POA: Diagnosis not present

## 2016-07-22 DIAGNOSIS — J3089 Other allergic rhinitis: Secondary | ICD-10-CM | POA: Diagnosis not present

## 2016-07-27 DIAGNOSIS — J3081 Allergic rhinitis due to animal (cat) (dog) hair and dander: Secondary | ICD-10-CM | POA: Diagnosis not present

## 2016-07-27 DIAGNOSIS — J301 Allergic rhinitis due to pollen: Secondary | ICD-10-CM | POA: Diagnosis not present

## 2016-07-27 DIAGNOSIS — J3089 Other allergic rhinitis: Secondary | ICD-10-CM | POA: Diagnosis not present

## 2016-08-05 DIAGNOSIS — J3089 Other allergic rhinitis: Secondary | ICD-10-CM | POA: Diagnosis not present

## 2016-08-05 DIAGNOSIS — J3081 Allergic rhinitis due to animal (cat) (dog) hair and dander: Secondary | ICD-10-CM | POA: Diagnosis not present

## 2016-08-05 DIAGNOSIS — J4530 Mild persistent asthma, uncomplicated: Secondary | ICD-10-CM | POA: Diagnosis not present

## 2016-08-05 DIAGNOSIS — J301 Allergic rhinitis due to pollen: Secondary | ICD-10-CM | POA: Diagnosis not present

## 2016-08-06 DIAGNOSIS — J301 Allergic rhinitis due to pollen: Secondary | ICD-10-CM | POA: Diagnosis not present

## 2016-08-10 DIAGNOSIS — J3089 Other allergic rhinitis: Secondary | ICD-10-CM | POA: Diagnosis not present

## 2016-08-10 DIAGNOSIS — J3081 Allergic rhinitis due to animal (cat) (dog) hair and dander: Secondary | ICD-10-CM | POA: Diagnosis not present

## 2016-08-10 DIAGNOSIS — J301 Allergic rhinitis due to pollen: Secondary | ICD-10-CM | POA: Diagnosis not present

## 2016-08-12 ENCOUNTER — Encounter: Payer: Self-pay | Admitting: Urology

## 2016-08-12 ENCOUNTER — Ambulatory Visit (INDEPENDENT_AMBULATORY_CARE_PROVIDER_SITE_OTHER): Payer: Self-pay | Admitting: Urology

## 2016-08-12 ENCOUNTER — Ambulatory Visit
Admission: RE | Admit: 2016-08-12 | Discharge: 2016-08-12 | Disposition: A | Discharge status: Home / Self Care | Payer: PPO | Source: Ambulatory Visit | Attending: Urology | Admitting: Urology

## 2016-08-12 ENCOUNTER — Other Ambulatory Visit: Payer: Self-pay | Admitting: Urology

## 2016-08-12 VITALS — BP 128/71 | HR 81 | Ht 64.0 in | Wt 189.9 lb

## 2016-08-12 DIAGNOSIS — R109 Unspecified abdominal pain: Secondary | ICD-10-CM | POA: Diagnosis not present

## 2016-08-12 DIAGNOSIS — N200 Calculus of kidney: Secondary | ICD-10-CM

## 2016-08-12 DIAGNOSIS — R3129 Other microscopic hematuria: Secondary | ICD-10-CM | POA: Diagnosis not present

## 2016-08-12 DIAGNOSIS — N952 Postmenopausal atrophic vaginitis: Secondary | ICD-10-CM

## 2016-08-12 DIAGNOSIS — N3941 Urge incontinence: Secondary | ICD-10-CM

## 2016-08-12 LAB — URINALYSIS, COMPLETE
Bilirubin, UA: NEGATIVE
Glucose, UA: NEGATIVE
NITRITE UA: NEGATIVE
SPEC GRAV UA: 1.02 (ref 1.005–1.030)
UUROB: 0.2 mg/dL (ref 0.2–1.0)
pH, UA: 6 (ref 5.0–7.5)

## 2016-08-12 LAB — MICROSCOPIC EXAMINATION: WBC, UA: >30 /hpf — AB (ref 0–?)

## 2016-08-12 NOTE — Progress Notes (Signed)
08/12/2016 3:28 PM   Brianna Cole Sep 19, 1940 [id number]  Referring provider: Lenard Simmer, MD [address] [place], [place] [address]  Chief Complaint  Patient presents with  . Hematuria    Microscopic 3 month follow up   . Vaginitis    HPI: Patient is 76 year old Caucasian female with urinary incontinence, history of hematuria, atrophic vaginitis and a history of nephrolithiasis who presents today for a three month follow-up.  Urinary incontinence Patient currently on Myrbetriq 25 mg daily.  She is having a great deal of bother with accidental loss of small amounts of urine, daytime urination, waking up at night because she has to urinate and urine loss associated with a strong desire to urinate. She has quite a bit of bother with the sudden urge to urinate was little or no warning.  She is not bothered at all with uncomfortable urge to urinate.  This is indicated by the OAB short form questionnaire.  Her PVR today is 0 mL.  Her UA is significant for greater than 30 WBC's/hpf and 11-30RBC's/hpf.    Microscopic hematuria She denies any gross hematuria.  11-30RBC's/hpf.  Urine sent for culture.  Patient has bilateral nephrolithiasis.    Vaginal atrophy Patient using the vaginal estrogen cream intermittently.  History of nephrolithiasis Patient denies any flank pain, passage of fragments or gross hematuria. KUB taken today demonstrates stable bilateral renal calculi.  I have independently reviewed the films.      PMH: Past Medical History:  Diagnosis Date  . Arthritis   . Asthma   . Cataract   . Chronic kidney disease   . Depression   . Diabetes mellitus without complication ([facility])   . Environmental allergies    Followed by Dr. Donneta Romberg  . GERD (gastroesophageal reflux disease)   . Hyperlipidemia   . Hypertension   . Kidney stones   . Thyroid disease   . Urinary incontinence     Surgical History: Past Surgical History:  Procedure Laterality Date  . ABDOMINAL  HYSTERECTOMY  1974  . CHOLECYSTECTOMY  1995  . KIDNEY STONE SURGERY    . VAGINAL DELIVERY     2, 1x forceps delivery    Home Medications:  Allergies as of 08/12/2016      Reactions   Shellfish Allergy Hives, Swelling   Wheat Bran Other (See Comments)   Iodinated Diagnostic Agents Rash      Medication List       Accurate as of 08/12/16  3:28 PM. Always use your most recent med list.          buPROPion 150 MG 12 hr tablet Commonly known as:  WELLBUTRIN SR Take 150 mg by mouth 2 (two) times daily.   conjugated estrogens vaginal cream Commonly known as:  PREMARIN Place 1 Applicatorful vaginally daily. Apply 0.5mg  (pea-sized amount)  just inside the vaginal introitus with a finger-tip every night for two weeks and then Monday, Wednesday and Friday nights.   diltiazem 180 MG 24 hr capsule Commonly known as:  CARDIZEM CD Take 1 capsule (180 mg total) by mouth daily.   estradiol 0.1 MG/GM vaginal cream Commonly known as:  ESTRACE VAGINAL Place 1 Applicatorful vaginally at bedtime.   estradiol 0.1 MG/GM vaginal cream Commonly known as:  ESTRACE VAGINAL Apply 0.5mg  (pea-sized amount)  just inside the vaginal introitus with a finger-tip every night for two weeks and then Monday, Wednesday and Friday nights.   fexofenadine 180 MG tablet Commonly known as:  ALLEGRA TAKE 1 TABLET  BY MOUTH ONCE A DAY (OTC*)   fluticasone 50 MCG/ACT nasal spray Commonly known as:  FLONASE Place 2 sprays into the nose daily.   Fluticasone-Salmeterol 100-50 MCG/DOSE Aepb Commonly known as:  ADVAIR Inhale 1 puff into the lungs every 12 (twelve) hours.   gabapentin 300 MG capsule Commonly known as:  NEURONTIN Take 1 capsule (300 mg total) by mouth 3 (three) times daily.   lansoprazole 15 MG capsule Commonly known as:  PREVACID Take 15 mg by mouth daily.   metoprolol succinate 25 MG 24 hr tablet Commonly known as:  TOPROL-XL   mirabegron ER 25 MG Tb24 tablet Commonly known as:   MYRBETRIQ Take 1 tablet (25 mg total) by mouth daily.   montelukast 10 MG tablet Commonly known as:  SINGULAIR Take 10 mg by mouth at bedtime.   ONE TOUCH ULTRA TEST test strip Generic drug:  glucose blood   pravastatin 20 MG tablet Commonly known as:  PRAVACHOL Take 20 mg by mouth daily.   PROAIR HFA 108 (90 Base) MCG/ACT inhaler Generic drug:  albuterol Inhale 2 puffs into the lungs every 6 (six) hours as needed for wheezing.   sertraline 100 MG tablet Commonly known as:  ZOLOFT Take 100 mg by mouth daily.   SYNTHROID 75 MCG tablet Generic drug:  levothyroxine 1 BY MOUTH EVERY MORNING , DO NOT SUBSTITUTE PLEASE.Marland Kitchen REPLACESDISCONTINUED LEVOXYL   TRADJENTA 5 MG Tabs tablet Generic drug:  linagliptin Take 5 mg by mouth daily.   traZODone 50 MG tablet Commonly known as:  DESYREL   Vitamin D (Ergocalciferol) 50000 units Caps capsule Commonly known as:  DRISDOL Take 50,000 Units by mouth every 7 (seven) days.   zoledronic acid 5 MG/100ML Soln injection Commonly known as:  RECLAST       Allergies:  Allergies  Allergen Reactions  . Shellfish Allergy Hives and Swelling  . Wheat Bran Other (See Comments)  . Iodinated Diagnostic Agents Rash    Family History: Family History  Problem Relation Age of Onset  . Arthritis Mother   . Hyperlipidemia Mother   . Hypertension Mother   . Kidney disease Mother   . Diabetes Mother   . Cancer Mother     colon  . Cancer Father     lung  . Bladder Cancer Neg Hx   . Kidney cancer Neg Hx   . Prostate cancer Neg Hx     Social History:  reports that she has never smoked. She has never used smokeless tobacco. She reports that she does not drink alcohol or use drugs.  ROS: UROLOGY Frequent Urination?: No Hard to postpone urination?: No Burning/pain with urination?: No Get up at night to urinate?: No Leakage of urine?: Yes Urine stream starts and stops?: No Trouble starting stream?: No Do you have to strain to urinate?:  No Blood in urine?: No Urinary tract infection?: No Sexually transmitted disease?: No Injury to kidneys or bladder?: No Painful intercourse?: No Weak stream?: No Currently pregnant?: No Vaginal bleeding?: No Last menstrual period?: n  Gastrointestinal Nausea?: No Vomiting?: No Indigestion/heartburn?: Yes Diarrhea?: No Constipation?: No  Constitutional Fever: No Night sweats?: No Weight loss?: No Fatigue?: Yes  Skin Skin rash/lesions?: No Itching?: No  Eyes Blurred vision?: No Double vision?: No  Ears/Nose/Throat Sore throat?: No Sinus problems?: Yes  Hematologic/Lymphatic Swollen glands?: No Easy bruising?: Yes  Cardiovascular Leg swelling?: Yes Chest pain?: No  Respiratory Cough?: No Shortness of breath?: Yes  Endocrine Excessive thirst?: Yes  Musculoskeletal Back pain?: No  Joint pain?: No  Neurological Headaches?: No Dizziness?: Yes  Psychologic Depression?: Yes Anxiety?: Yes  Physical Exam: BP 128/71   Pulse 81   Ht 5\' 4"  (1.626 m)   Wt 189 lb 14.4 oz (86.1 kg)   BMI 32.60 kg/m   Constitutional: Well nourished. Alert and oriented, No acute distress. HEENT: [place] AT, moist mucus membranes. Trachea midline, no masses. Cardiovascular: No clubbing, cyanosis, or edema. Respiratory: Normal respiratory effort, no increased work of breathing. GI: Abdomen is soft, non tender, non distended, no abdominal masses. Liver and spleen not palpable.  No hernias appreciated.  Stool sample for occult testing is not indicated.   GU: No CVA tenderness.  No bladder fullness or masses.  Atrophic external genitalia, normal pubic hair distribution, no lesions.  Normal urethral meatus, no lesions, no prolapse, no discharge.   Urethral caruncle is noted.  No bladder fullness, tenderness or masses. Pale vagina mucosa, poor estrogen effect, no discharge, no lesions, poor pelvic support, Grade II cystocele is noted.  No rectocele noted.  Cervix, uterus and adnexa are  surgically absent.   Anus and perineum are without rashes or lesions.    Skin: No rashes, bruises or suspicious lesions. Lymph: No cervical or inguinal adenopathy. Neurologic: Grossly intact, no focal deficits, moving all 4 extremities. Psychiatric: Normal mood and affect.  Laboratory Data:  Lab Results  Component Value Date   CREATININE 1.3 (H) 10/09/2012    Lab Results  Component Value Date   HGBA1C 5.2 10/09/2012    Urinalysis > 30 WBC's.  11/30 RBC's/hpf.  See EPIC.  Pertinent imaging Results for PAYZLEE, CARBON (MRN [id number]) as of 05/13/2015 14:26  Ref. Range 05/13/2015 13:47  Scan Result Unknown 30 mL   CLINICAL DATA:  History of right kidney stone, some flank pain  EXAM: ABDOMEN - 1 VIEW  COMPARISON:  KUB of 05/12/2016 and 05/13/2015  FINDINGS: There is no change in 2 right renal calculi. The larger overlies the upper pole measuring 14 mm in diameter with the smaller in the lower pole of 8 mm in diameter. The right kidney appears somewhat atrophic. No definite left renal calculi are seen. No calcifications are noted along the expected courses of the ureters. There are degenerative changes in the lower lumbar spine and within the pubic symphysis.  IMPRESSION: Stable right renal calculi. The right kidney appears somewhat atrophic.   Electronically Signed   By: Ivar Drape M.D.   On: 08/12/2016 15:23   Assessment & Plan:   1. Incontinence:   Patient will continue Myrbetriq 25 mg daily.  I have sent a refill to her pharmacy.  She will return in 1 year for symptom recheck and PVR.  2. Microscopic hematuria  - 11-30 RBC's/hpf on today's exam  - Urinalysis, Complete  - urine sent for culture  3. Atrophic vaginitis  - Patient will apply the vaginal estrogen cream 3 nights weekly  - She will return in 12 months for exam  4. Kidney stones: Patient with stable right nephrolithiasis. Possible punctate left renal stone.  KUB completed on  08/12/2016.  CBC, BMP checked today as patient would like to restart Urocit-K.  We will continue to monitor with yearly KUB's.  Return for pending urine culture results.  Zara Council, [facility] [address], [place], [place] [address] [phone]

## 2016-08-14 LAB — CULTURE, URINE COMPREHENSIVE

## 2016-08-17 DIAGNOSIS — J301 Allergic rhinitis due to pollen: Secondary | ICD-10-CM | POA: Diagnosis not present

## 2016-08-17 DIAGNOSIS — J3081 Allergic rhinitis due to animal (cat) (dog) hair and dander: Secondary | ICD-10-CM | POA: Diagnosis not present

## 2016-08-17 DIAGNOSIS — J3089 Other allergic rhinitis: Secondary | ICD-10-CM | POA: Diagnosis not present

## 2016-08-18 ENCOUNTER — Telehealth: Payer: Self-pay | Admitting: Urology

## 2016-08-18 DIAGNOSIS — R3129 Other microscopic hematuria: Secondary | ICD-10-CM

## 2016-08-18 NOTE — Telephone Encounter (Signed)
LMOM

## 2016-08-18 NOTE — Telephone Encounter (Signed)
Would you notify the patient the her urine culture was negative, but she had blood in her urine?  I know she has a stone and that can cause blood in the urine, but so can other conditions.  I would like her to have a non contrast CT at this time.

## 2016-08-19 NOTE — Telephone Encounter (Signed)
Spoke with pt in reference to ucx results and needing a CT. Pt voiced understanding. Orders placed.

## 2016-08-26 DIAGNOSIS — J301 Allergic rhinitis due to pollen: Secondary | ICD-10-CM | POA: Diagnosis not present

## 2016-08-26 DIAGNOSIS — J3081 Allergic rhinitis due to animal (cat) (dog) hair and dander: Secondary | ICD-10-CM | POA: Diagnosis not present

## 2016-08-26 DIAGNOSIS — J3089 Other allergic rhinitis: Secondary | ICD-10-CM | POA: Diagnosis not present

## 2016-08-31 DIAGNOSIS — J301 Allergic rhinitis due to pollen: Secondary | ICD-10-CM | POA: Diagnosis not present

## 2016-08-31 DIAGNOSIS — J3081 Allergic rhinitis due to animal (cat) (dog) hair and dander: Secondary | ICD-10-CM | POA: Diagnosis not present

## 2016-08-31 DIAGNOSIS — J3089 Other allergic rhinitis: Secondary | ICD-10-CM | POA: Diagnosis not present

## 2016-09-07 DIAGNOSIS — J3089 Other allergic rhinitis: Secondary | ICD-10-CM | POA: Diagnosis not present

## 2016-09-07 DIAGNOSIS — J3081 Allergic rhinitis due to animal (cat) (dog) hair and dander: Secondary | ICD-10-CM | POA: Diagnosis not present

## 2016-09-07 DIAGNOSIS — J301 Allergic rhinitis due to pollen: Secondary | ICD-10-CM | POA: Diagnosis not present

## 2016-09-09 ENCOUNTER — Ambulatory Visit
Admission: RE | Admit: 2016-09-09 | Discharge: 2016-09-09 | Disposition: A | Discharge status: Home / Self Care | Payer: PPO | Source: Ambulatory Visit | Attending: Urology | Admitting: Urology

## 2016-09-09 DIAGNOSIS — R109 Unspecified abdominal pain: Secondary | ICD-10-CM | POA: Diagnosis not present

## 2016-09-09 DIAGNOSIS — I700 Atherosclerosis of aorta: Secondary | ICD-10-CM | POA: Diagnosis not present

## 2016-09-09 DIAGNOSIS — R3129 Other microscopic hematuria: Secondary | ICD-10-CM | POA: Insufficient documentation

## 2016-09-09 DIAGNOSIS — N200 Calculus of kidney: Secondary | ICD-10-CM | POA: Diagnosis not present

## 2016-09-13 ENCOUNTER — Ambulatory Visit: Payer: Self-pay | Admitting: Urology

## 2016-09-14 ENCOUNTER — Ambulatory Visit (INDEPENDENT_AMBULATORY_CARE_PROVIDER_SITE_OTHER): Payer: PPO | Admitting: Urology

## 2016-09-14 ENCOUNTER — Encounter: Payer: Self-pay | Admitting: Urology

## 2016-09-14 VITALS — BP 151/72 | HR 62 | Ht 61.0 in | Wt 197.7 lb

## 2016-09-14 DIAGNOSIS — E119 Type 2 diabetes mellitus without complications: Secondary | ICD-10-CM | POA: Diagnosis not present

## 2016-09-14 DIAGNOSIS — R3129 Other microscopic hematuria: Secondary | ICD-10-CM | POA: Diagnosis not present

## 2016-09-14 DIAGNOSIS — N200 Calculus of kidney: Secondary | ICD-10-CM

## 2016-09-14 DIAGNOSIS — R93429 Abnormal radiologic findings on diagnostic imaging of unspecified kidney: Secondary | ICD-10-CM | POA: Diagnosis not present

## 2016-09-14 DIAGNOSIS — G4701 Insomnia due to medical condition: Secondary | ICD-10-CM | POA: Diagnosis not present

## 2016-09-14 DIAGNOSIS — E039 Hypothyroidism, unspecified: Secondary | ICD-10-CM | POA: Diagnosis not present

## 2016-09-14 DIAGNOSIS — E669 Obesity, unspecified: Secondary | ICD-10-CM | POA: Diagnosis not present

## 2016-09-14 DIAGNOSIS — E559 Vitamin D deficiency, unspecified: Secondary | ICD-10-CM | POA: Diagnosis not present

## 2016-09-14 DIAGNOSIS — I10 Essential (primary) hypertension: Secondary | ICD-10-CM | POA: Diagnosis not present

## 2016-09-14 DIAGNOSIS — E790 Hyperuricemia without signs of inflammatory arthritis and tophaceous disease: Secondary | ICD-10-CM | POA: Diagnosis not present

## 2016-09-14 DIAGNOSIS — M810 Age-related osteoporosis without current pathological fracture: Secondary | ICD-10-CM | POA: Diagnosis not present

## 2016-09-14 DIAGNOSIS — E7800 Pure hypercholesterolemia, unspecified: Secondary | ICD-10-CM | POA: Diagnosis not present

## 2016-09-14 DIAGNOSIS — K224 Dyskinesia of esophagus: Secondary | ICD-10-CM | POA: Diagnosis not present

## 2016-09-14 DIAGNOSIS — E1165 Type 2 diabetes mellitus with hyperglycemia: Secondary | ICD-10-CM | POA: Diagnosis not present

## 2016-09-14 DIAGNOSIS — N189 Chronic kidney disease, unspecified: Secondary | ICD-10-CM | POA: Diagnosis not present

## 2016-09-14 LAB — URINALYSIS, COMPLETE
BILIRUBIN UA: NEGATIVE
Glucose, UA: NEGATIVE
KETONES UA: NEGATIVE
NITRITE UA: NEGATIVE
SPEC GRAV UA: 1.025 (ref 1.005–1.030)
UUROB: 0.2 mg/dL (ref 0.2–1.0)
pH, UA: 5.5 (ref 5.0–7.5)

## 2016-09-14 LAB — MICROSCOPIC EXAMINATION

## 2016-09-14 NOTE — Progress Notes (Signed)
 09/14/2016 3:57 PM   Brianna Cole 12/03/1940 [id number]  Referring provider: Shamil J Morayati, MD [address] [place], [place] [address]  Chief Complaint  Patient presents with  . Results    CT    HPI: 76 yo WF who presents today to discuss the results of her non contrast CT scan for the evaluation of AHM.  (CT Urogram was not performed due to patient's allergy to contrast dye)  Microscopic hematuria She denies any gross hematuria.  UA with 11-30RBC's/hpf and urine culture was negative on 08/12/2016.  Non contrast CT performed on 09/09/2016 noted nonobstructing renal calculi.  No evidence of ureteral calculi, hydronephrosis, or other acute findings.  Aortic atherosclerosis.  I have independently reviewed the films.  There was an area in the right renal pelvis that appeared abnormal.  Her UA today demonstrates a 3-10 RBC's and > 30 WBC's with moderate bacteria.   History of nephrolithiasis Patient denies any flank pain, passage of fragments or gross hematuria. CT demonstrates stable bilateral renal calculi.  I have independently reviewed the films.    Urinary incontinence Patient currently on Myrbetriq 25 mg daily.  She is having a great deal of bother with accidental loss of small amounts of urine, daytime urination, waking up at night because she has to urinate and urine loss associated with a strong desire to urinate. She has quite a bit of bother with the sudden urge to urinate was little or no warning.  She is not bothered at all with uncomfortable urge to urinate.    Vaginal atrophy Patient using the vaginal estrogen cream intermittently.    PMH: Past Medical History:  Diagnosis Date  . Arthritis   . Asthma   . Cataract   . Chronic kidney disease   . Depression   . Diabetes mellitus without complication ([facility])   . Environmental allergies    Followed by Dr. Sharma  . GERD (gastroesophageal reflux disease)   . Hyperlipidemia   . Hypertension   . Kidney stones   .  Thyroid disease   . Urinary incontinence     Surgical History: Past Surgical History:  Procedure Laterality Date  . ABDOMINAL HYSTERECTOMY  1974  . CHOLECYSTECTOMY  1995  . KIDNEY STONE SURGERY    . VAGINAL DELIVERY     2, 1x forceps delivery    Home Medications:  Allergies as of 09/14/2016      Reactions   Shellfish Allergy Hives, Swelling   Wheat Bran Other (See Comments)   Iodinated Diagnostic Agents Rash      Medication List       Accurate as of 09/14/16  3:57 PM. Always use your most recent med list.          buPROPion 150 MG 12 hr tablet Commonly known as:  WELLBUTRIN SR Take 150 mg by mouth 2 (two) times daily.   conjugated estrogens vaginal cream Commonly known as:  PREMARIN Place 1 Applicatorful vaginally daily. Apply 0.5mg (pea-sized amount)  just inside the vaginal introitus with a finger-tip every night for two weeks and then Monday, Wednesday and Friday nights.   diltiazem 180 MG 24 hr capsule Commonly known as:  CARDIZEM CD Take 1 capsule (180 mg total) by mouth daily.   estradiol 0.1 MG/GM vaginal cream Commonly known as:  ESTRACE VAGINAL Place 1 Applicatorful vaginally at bedtime.   estradiol 0.1 MG/GM vaginal cream Commonly known as:  ESTRACE VAGINAL Apply 0.5mg (pea-sized amount)  just inside the vaginal introitus with a   finger-tip every night for two weeks and then Monday, Wednesday and Friday nights.   fexofenadine 180 MG tablet Commonly known as:  ALLEGRA TAKE 1 TABLET BY MOUTH ONCE A DAY (OTC*)   fluticasone 50 MCG/ACT nasal spray Commonly known as:  FLONASE Place 2 sprays into the nose daily.   Fluticasone-Salmeterol 100-50 MCG/DOSE Aepb Commonly known as:  ADVAIR Inhale 1 puff into the lungs every 12 (twelve) hours.   gabapentin 300 MG capsule Commonly known as:  NEURONTIN Take 1 capsule (300 mg total) by mouth 3 (three) times daily.   lansoprazole 15 MG capsule Commonly known as:  PREVACID Take 15 mg by mouth daily.     metoprolol succinate 25 MG 24 hr tablet Commonly known as:  TOPROL-XL   mirabegron ER 25 MG Tb24 tablet Commonly known as:  MYRBETRIQ Take 1 tablet (25 mg total) by mouth daily.   montelukast 10 MG tablet Commonly known as:  SINGULAIR Take 10 mg by mouth at bedtime.   ONE TOUCH ULTRA TEST test strip Generic drug:  glucose blood   pravastatin 20 MG tablet Commonly known as:  PRAVACHOL Take 20 mg by mouth daily.   PROAIR HFA 108 (90 Base) MCG/ACT inhaler Generic drug:  albuterol Inhale 2 puffs into the lungs every 6 (six) hours as needed for wheezing.   sertraline 100 MG tablet Commonly known as:  ZOLOFT Take 100 mg by mouth daily.   SYNTHROID 75 MCG tablet Generic drug:  levothyroxine 1 BY MOUTH EVERY MORNING , DO NOT SUBSTITUTE PLEASE.. REPLACESDISCONTINUED LEVOXYL   TRADJENTA 5 MG Tabs tablet Generic drug:  linagliptin Take 5 mg by mouth daily.   traZODone 50 MG tablet Commonly known as:  DESYREL   Vitamin D (Ergocalciferol) 50000 units Caps capsule Commonly known as:  DRISDOL Take 50,000 Units by mouth every 7 (seven) days.   zoledronic acid 5 MG/100ML Soln injection Commonly known as:  RECLAST       Allergies:  Allergies  Allergen Reactions  . Shellfish Allergy Hives and Swelling  . Wheat Bran Other (See Comments)  . Iodinated Diagnostic Agents Rash    Family History: Family History  Problem Relation Age of Onset  . Arthritis Mother   . Hyperlipidemia Mother   . Hypertension Mother   . Kidney disease Mother   . Diabetes Mother   . Cancer Mother     colon  . Cancer Father     lung  . Bladder Cancer Neg Hx   . Kidney cancer Neg Hx   . Prostate cancer Neg Hx     Social History:  reports that she has never smoked. She has never used smokeless tobacco. She reports that she does not drink alcohol or use drugs.  ROS: UROLOGY Frequent Urination?: No Hard to postpone urination?: No Burning/pain with urination?: No Get up at night to urinate?:  No Leakage of urine?: No Urine stream starts and stops?: No Trouble starting stream?: No Do you have to strain to urinate?: No Blood in urine?: No Urinary tract infection?: No Sexually transmitted disease?: No Injury to kidneys or bladder?: No Painful intercourse?: No Weak stream?: No Currently pregnant?: No Vaginal bleeding?: No Last menstrual period?: n  Gastrointestinal Nausea?: No Vomiting?: No Indigestion/heartburn?: No Diarrhea?: No Constipation?: Yes  Constitutional Fever: No Night sweats?: No Weight loss?: No Fatigue?: No  Skin Skin rash/lesions?: No Itching?: No  Eyes Blurred vision?: No Double vision?: No  Ears/Nose/Throat Sore throat?: No Sinus problems?: No  Hematologic/Lymphatic Swollen glands?: No Easy   bruising?: No  Cardiovascular Leg swelling?: No Chest pain?: No  Respiratory Cough?: No Shortness of breath?: Yes  Endocrine Excessive thirst?: No  Musculoskeletal Back pain?: Yes Joint pain?: Yes  Neurological Headaches?: No Dizziness?: Yes  Psychologic Depression?: Yes Anxiety?: Yes  Physical Exam: BP (!) 151/72   Pulse 62   Ht 5' 1" (1.549 m)   Wt 197 lb 11.2 oz (89.7 kg)   BMI 37.36 kg/m   Constitutional: Well nourished. Alert and oriented, No acute distress. HEENT: [place] AT, moist mucus membranes. Trachea midline, no masses. Cardiovascular: No clubbing, cyanosis, or edema. Respiratory: Normal respiratory effort, no increased work of breathing. GI: Abdomen is soft, non tender, non distended, no abdominal masses. Liver and spleen not palpable.  No hernias appreciated.  Stool sample for occult testing is not indicated.   GU: No CVA tenderness.  No bladder fullness or masses.   Skin: No rashes, bruises or suspicious lesions. Lymph: No cervical or inguinal adenopathy. Neurologic: Grossly intact, no focal deficits, moving all 4 extremities. Psychiatric: Normal mood and affect.  Laboratory Data:  Lab Results  Component  Value Date   CREATININE 1.3 (H) 10/09/2012    Lab Results  Component Value Date   HGBA1C 5.2 10/09/2012    Urinalysis > 30 WBC's.  11 - 30 RBC's/hpf.  Moderate bacteria. See EPIC.  Pertinent imaging CLINICAL DATA:  Right flank pain. Microscopic hematuria. Nephrolithiasis. Worsening urinary incontinence.  EXAM: CT ABDOMEN AND PELVIS WITHOUT CONTRAST  TECHNIQUE: Multidetector CT imaging of the abdomen and pelvis was performed following the standard protocol without IV contrast.  COMPARISON:  08/01/2007  FINDINGS: Lower chest: No acute findings.  Hepatobiliary: No masses visualized on this unenhanced exam. Prior cholecystectomy noted. No evidence of biliary dilatation.  Pancreas: No mass or inflammatory process visualized on this unenhanced exam.  Spleen:  Within normal limits in size.  Adrenals/Urinary tract: 2 right renal calculi are seen, largest in the upper pole measuring 11 mm. A tiny 1-2 mm calculus is seen in the upper pole of the left kidney. No evidence of ureteral calculi or hydronephrosis. Unopacified urinary bladder is unremarkable in appearance.  Stomach/Bowel: No evidence of obstruction, inflammatory process, or abnormal fluid collections. Normal appendix visualized.  Vascular/Lymphatic: No pathologically enlarged lymph nodes identified. No evidence of abdominal aortic aneurysm. Aortic atherosclerosis.  Reproductive: Prior hysterectomy noted. Adnexal regions are unremarkable in appearance.  Other:  None.  Musculoskeletal:  No suspicious bone lesions identified.  IMPRESSION: Nonobstructing renal calculi. No evidence of ureteral calculi, hydronephrosis, or other acute findings.  Aortic atherosclerosis.   Electronically Signed   By: John  Stahl M.D.   On: 09/09/2016 13:20   Assessment & Plan:   Patient will undergo cystoscopy with bilateral retrogrades with right ureteroscopy with right laser lithotripsy with right  ureteral stent placement for an abnormal appearing right renal pelvis on CT and two right renal stones, largest 11 mm in size.    1. Right renal stones  - schedule right ureteroscopy with laser lithotripsy and ureteral stent placement  - explained to the patient how the procedure is performed and the risks involved  - informed patient that they will have a stent placed during the procedure and will remain in place after the procedure for a short time.   - stent may be removed in the office with a cystoscope or patient may be instructed to remove the stent themselves by the string  - described "stent pain" as feelings of needing to urinate/overactive bladder and a warm,   tingling sensation to intense pain in the affected flank  - residual stones within the kidney or ureter may be present after the procedure and may need to have these addressed at a different encounter  - injury to the ureter is the most common intra-operative risk, it may result in an open procedure to correct the defect  - infection and bleeding are also risks  - explained the risks of general anesthesia, such as: MI, CVA, paralysis, coma and/or death.  - UA  - urine culture  - CBC  - BMP  - advised to contact our office or seek treatment in the ED if becomes febrile or pain/ vomiting are difficult control in order to arrange for emergent/urgent intervention  2. Abnormal appearance in right renal pelvis seen on CT  - patient will undergo bilateral retrogrades, right ureteroscopy with possible biopsy at the same time of ureteroscopic extraction of her right renal stones  - risks explained  - see above  3. Microscopic hematuria  - patient will undergo cystoscopy with bilateral retrogrades at the time of her ureteroscopic stone extraction to complete hematuria workup  - see above  3. Incontinence:   Patient will continue Myrbetriq 25 mg daily.    She will return in 1 year for symptom recheck and PVR.  4. Atrophic  vaginitis  - Patient will apply the vaginal estrogen cream 3 nights weekly  - She will return in 12 months for exam  5. Kidney stones: Patient will undergo right ureteroscopy with laser lithotripsy with ureteral stent placement for definitive treatment of her right renal stones.  Possible punctate left renal stone.    Return for cystoscopy, RTG's, ureteroscopy with possible biopsy, URS/LL/ureteral stent placement.  Adabella Stanis, PA-C  [facility] [address], [address] [place], [place] [address] [phone]   

## 2016-09-15 ENCOUNTER — Other Ambulatory Visit: Payer: Self-pay | Admitting: Radiology

## 2016-09-15 ENCOUNTER — Telehealth: Payer: Self-pay | Admitting: Radiology

## 2016-09-15 DIAGNOSIS — N200 Calculus of kidney: Secondary | ICD-10-CM

## 2016-09-15 LAB — CBC WITH DIFFERENTIAL/PLATELET
BASOS: 1 %
Basophils Absolute: 0.1 10*3/uL (ref 0.0–0.2)
EOS (ABSOLUTE): 0.2 10*3/uL (ref 0.0–0.4)
EOS: 3 %
HEMATOCRIT: 37.3 % (ref 34.0–46.6)
HEMOGLOBIN: 12.4 g/dL (ref 11.1–15.9)
IMMATURE GRANS (ABS): 0 10*3/uL (ref 0.0–0.1)
IMMATURE GRANULOCYTES: 0 %
LYMPHS: 34 %
Lymphocytes Absolute: 2.7 10*3/uL (ref 0.7–3.1)
MCH: 30.9 pg (ref 26.6–33.0)
MCHC: 33.2 g/dL (ref 31.5–35.7)
MCV: 93 fL (ref 79–97)
Monocytes Absolute: 0.7 10*3/uL (ref 0.1–0.9)
Monocytes: 8 %
NEUTROS PCT: 54 %
Neutrophils Absolute: 4.3 10*3/uL (ref 1.4–7.0)
Platelets: 218 10*3/uL (ref 150–379)
RBC: 4.01 x10E6/uL (ref 3.77–5.28)
RDW: 13.8 % (ref 12.3–15.4)
WBC: 8 10*3/uL (ref 3.4–10.8)

## 2016-09-15 LAB — BASIC METABOLIC PANEL
BUN/Creatinine Ratio: 13 (ref 12–28)
BUN: 17 mg/dL (ref 8–27)
CALCIUM: 9.7 mg/dL (ref 8.7–10.3)
CHLORIDE: 102 mmol/L (ref 96–106)
CO2: 21 mmol/L (ref 18–29)
CREATININE: 1.31 mg/dL — AB (ref 0.57–1.00)
GFR, EST AFRICAN AMERICAN: 46 mL/min/{1.73_m2} — AB (ref >59)
GFR, EST NON AFRICAN AMERICAN: 40 mL/min/{1.73_m2} — AB (ref >59)
Glucose: 118 mg/dL — ABNORMAL HIGH (ref 65–99)
Potassium: 4.3 mmol/L (ref 3.5–5.2)
Sodium: 141 mmol/L (ref 134–144)

## 2016-09-15 NOTE — Telephone Encounter (Signed)
Notified pt of surgery scheduled with Dr Erlene Quan on 09/21/16, pre-admit testing appt on 09/16/16 @11 :45 & to call day prior to surgery for arrival time to SDS. Advised pt to hold ASA 81mg  per Zara Council until after procedure. Pt voices understanding.

## 2016-09-16 ENCOUNTER — Encounter
Admission: RE | Admit: 2016-09-16 | Discharge: 2016-09-16 | Disposition: A | Discharge status: Home / Self Care | Payer: PPO | Source: Ambulatory Visit | Attending: Urology | Admitting: Urology

## 2016-09-16 DIAGNOSIS — I10 Essential (primary) hypertension: Secondary | ICD-10-CM | POA: Diagnosis not present

## 2016-09-16 DIAGNOSIS — Z01810 Encounter for preprocedural cardiovascular examination: Secondary | ICD-10-CM | POA: Diagnosis not present

## 2016-09-16 HISTORY — DX: Anxiety disorder, unspecified: F41.9

## 2016-09-16 HISTORY — DX: Personal history of urinary calculi: Z87.442

## 2016-09-16 HISTORY — DX: Personal history of other diseases of the digestive system: Z87.19

## 2016-09-16 LAB — CULTURE, URINE COMPREHENSIVE

## 2016-09-16 NOTE — Patient Instructions (Addendum)
Your procedure is scheduled on: September 21, 2016 [facility]) Report to Same Day Surgery 2nd floor medical mall ([place] Entrance-take elevator on left to 2nd floor.  Check in with surgery information desk.) To find out your arrival time please call [phone] between 1PM - 3PM on September 20, 2016 (MONDAY)   Remember: Instructions that are not followed completely may result in serious medical risk, up to and including death, or upon the discretion of your surgeon and anesthesiologist your surgery may need to be rescheduled.    _x___ 1. Do not eat food or drink liquids after midnight. No gum chewing or                              hard candies.     __x__ 2. No Alcohol for 24 hours before or after surgery.   __x__3. No Smoking for 24 prior to surgery.   ____  4. Bring all medications with you on the day of surgery if instructed.    __x__ 5. Notify your doctor if there is any change in your medical condition     (cold, fever, infections).     Do not wear jewelry, make-up, hairpins, clips or nail polish.  Do not wear lotions, powders, or perfumes. You may wear deodorant.  Do not shave 48 hours prior to surgery. Men may shave face and neck.  Do not bring valuables to the hospital.    [facility] is not responsible for any belongings or valuables.               Contacts, dentures or bridgework may not be worn into surgery.  Leave your suitcase in the car. After surgery it may be brought to your room.  For patients admitted to the hospital, discharge time is determined by your                       treatment team.   Patients discharged the day of surgery will not be allowed to drive home.  You will need someone to drive you home and stay with you the night of your procedure.    Please read over the following fact sheets that you were given:   [facility] Preparing for Surgery and or MRSA Information   _x___ Take anti-hypertensive (unless it includes a diuretic), cardiac, seizure,  asthma,     anti-reflux and psychiatric medicines. These include:  1. WELLBUTRIN  2. SYNTHROID  3. GABAPENTIN  4. PREVACID  5. METOPROLOL  6. SERTRALINE              ____Fleets enema or Magnesium Citrate as directed.   ___ Use CHG Soap or sage wipes as directed on instruction sheet   _x___ Use inhalers on the day of surgery and bring to hospital day of surgery (uSE ALBUTEROL AND [place])  ____ Stop Metformin and Janumet 2 days prior to surgery.    ____ Take 1/2 of usual insulin dose the night before surgery and none on the morning     surgery.   _x___ Follow recommendations from Cardiologist, Pulmonologist or PCP regarding          stopping Aspirin, Coumadin, Pllavix ,Eliquis, Effient, or Pradaxa, and Pletal.  X____Stop Anti-inflammatories such as Advil, Aleve, Ibuprofen, Motrin, Naproxen, Naprosyn, Goodies powders or aspirin products. OK to take Tylenol and  Celebrex.   _x___ Stop supplements until after surgery.  But may continue Vitamin D, Vitamin B,       and multivitamin.   ____ Bring C-Pap to the hospital.   DO NOT TAKE TRADJENTA THE MORNING OF SURGERY

## 2016-09-17 ENCOUNTER — Telehealth: Payer: Self-pay

## 2016-09-17 DIAGNOSIS — Z23 Encounter for immunization: Secondary | ICD-10-CM | POA: Diagnosis not present

## 2016-09-17 DIAGNOSIS — S0003XA Contusion of scalp, initial encounter: Secondary | ICD-10-CM | POA: Diagnosis not present

## 2016-09-17 DIAGNOSIS — N390 Urinary tract infection, site not specified: Secondary | ICD-10-CM

## 2016-09-17 MED ORDER — AMOXICILLIN-POT CLAVULANATE 875-125 MG PO TABS: 1.0000 | ORAL_TABLET | Freq: Two times a day (BID) | ORAL | 0 refills | Status: AC

## 2016-09-17 NOTE — Telephone Encounter (Signed)
LMOM-medication sent to pharmacy 

## 2016-09-17 NOTE — Pre-Procedure Instructions (Signed)
Dr. Andree Elk reviewed EKG performed on September 16, 2016 and stated should be okay for urology surgery.

## 2016-09-17 NOTE — Telephone Encounter (Signed)
-----   Message from [place], RN sent at 09/17/2016  9:37 AM EST -----   ----- Message ----- From: Nori Riis, PA-C Sent: 09/17/2016   8:30 AM To: Ranell Patrick, RN  Patient has a +UCx.  They need to start Augmentin 875/125,  one tablet twice daily for seven days.  They also need to take a probiotic with the antibiotic course.  She is scheduled for a cystoscopy with bilateral retrogrades with right ureteroscopy with right laser lithotripsy with right ureteral stent placement for an abnormal appearing right renal pelvis on CT and two right renal stones, largest 11 mm in size on 09/21/2016.  Would you make Dr. Erlene Quan aware of her urine culture as well?

## 2016-09-19 NOTE — Telephone Encounter (Signed)
Opened in error

## 2016-09-20 MED ORDER — CEFAZOLIN SODIUM-DEXTROSE 2-4 GM/100ML-% IV SOLN
2.0000 g | INTRAVENOUS | Status: AC
  Administered 2016-09-21: 2 g via INTRAVENOUS

## 2016-09-20 NOTE — Telephone Encounter (Signed)
Spoke with pharmacy who stated pt did pick up medication over the weekend.

## 2016-09-21 ENCOUNTER — Ambulatory Visit: Payer: PPO | Admitting: Anesthesiology

## 2016-09-21 ENCOUNTER — Ambulatory Visit
Admission: RE | Admit: 2016-09-21 | Discharge: 2016-09-21 | Disposition: A | Discharge status: Home / Self Care | Payer: PPO | Source: Ambulatory Visit | Attending: Urology | Admitting: Urology

## 2016-09-21 ENCOUNTER — Encounter
Admission: RE | Disposition: A | Discharge status: Home / Self Care | Payer: Self-pay | Source: Ambulatory Visit | Attending: Urology

## 2016-09-21 ENCOUNTER — Encounter: Payer: Self-pay | Admitting: *Deleted

## 2016-09-21 DIAGNOSIS — Z87442 Personal history of urinary calculi: Secondary | ICD-10-CM | POA: Diagnosis not present

## 2016-09-21 DIAGNOSIS — R32 Unspecified urinary incontinence: Secondary | ICD-10-CM | POA: Diagnosis not present

## 2016-09-21 DIAGNOSIS — N132 Hydronephrosis with renal and ureteral calculous obstruction: Secondary | ICD-10-CM | POA: Diagnosis not present

## 2016-09-21 DIAGNOSIS — N952 Postmenopausal atrophic vaginitis: Secondary | ICD-10-CM | POA: Insufficient documentation

## 2016-09-21 DIAGNOSIS — Z8249 Family history of ischemic heart disease and other diseases of the circulatory system: Secondary | ICD-10-CM | POA: Insufficient documentation

## 2016-09-21 DIAGNOSIS — K219 Gastro-esophageal reflux disease without esophagitis: Secondary | ICD-10-CM | POA: Insufficient documentation

## 2016-09-21 DIAGNOSIS — Z800 Family history of malignant neoplasm of digestive organs: Secondary | ICD-10-CM | POA: Insufficient documentation

## 2016-09-21 DIAGNOSIS — Z79890 Hormone replacement therapy (postmenopausal): Secondary | ICD-10-CM | POA: Insufficient documentation

## 2016-09-21 DIAGNOSIS — I129 Hypertensive chronic kidney disease with stage 1 through stage 4 chronic kidney disease, or unspecified chronic kidney disease: Secondary | ICD-10-CM | POA: Insufficient documentation

## 2016-09-21 DIAGNOSIS — Z801 Family history of malignant neoplasm of trachea, bronchus and lung: Secondary | ICD-10-CM | POA: Insufficient documentation

## 2016-09-21 DIAGNOSIS — Z833 Family history of diabetes mellitus: Secondary | ICD-10-CM | POA: Insufficient documentation

## 2016-09-21 DIAGNOSIS — R311 Benign essential microscopic hematuria: Secondary | ICD-10-CM | POA: Diagnosis not present

## 2016-09-21 DIAGNOSIS — Z7951 Long term (current) use of inhaled steroids: Secondary | ICD-10-CM | POA: Insufficient documentation

## 2016-09-21 DIAGNOSIS — N200 Calculus of kidney: Secondary | ICD-10-CM | POA: Diagnosis not present

## 2016-09-21 DIAGNOSIS — E079 Disorder of thyroid, unspecified: Secondary | ICD-10-CM | POA: Diagnosis not present

## 2016-09-21 DIAGNOSIS — Z79899 Other long term (current) drug therapy: Secondary | ICD-10-CM | POA: Insufficient documentation

## 2016-09-21 DIAGNOSIS — R3129 Other microscopic hematuria: Secondary | ICD-10-CM | POA: Insufficient documentation

## 2016-09-21 DIAGNOSIS — F329 Major depressive disorder, single episode, unspecified: Secondary | ICD-10-CM | POA: Diagnosis not present

## 2016-09-21 DIAGNOSIS — I700 Atherosclerosis of aorta: Secondary | ICD-10-CM | POA: Diagnosis not present

## 2016-09-21 DIAGNOSIS — N189 Chronic kidney disease, unspecified: Secondary | ICD-10-CM | POA: Diagnosis not present

## 2016-09-21 DIAGNOSIS — Z90710 Acquired absence of both cervix and uterus: Secondary | ICD-10-CM | POA: Diagnosis not present

## 2016-09-21 DIAGNOSIS — Z9102 Food additives allergy status: Secondary | ICD-10-CM | POA: Insufficient documentation

## 2016-09-21 DIAGNOSIS — Z91013 Allergy to seafood: Secondary | ICD-10-CM | POA: Insufficient documentation

## 2016-09-21 DIAGNOSIS — E785 Hyperlipidemia, unspecified: Secondary | ICD-10-CM | POA: Insufficient documentation

## 2016-09-21 DIAGNOSIS — Z8261 Family history of arthritis: Secondary | ICD-10-CM | POA: Insufficient documentation

## 2016-09-21 DIAGNOSIS — Z9889 Other specified postprocedural states: Secondary | ICD-10-CM | POA: Insufficient documentation

## 2016-09-21 DIAGNOSIS — J45909 Unspecified asthma, uncomplicated: Secondary | ICD-10-CM | POA: Insufficient documentation

## 2016-09-21 DIAGNOSIS — Z9049 Acquired absence of other specified parts of digestive tract: Secondary | ICD-10-CM | POA: Diagnosis not present

## 2016-09-21 DIAGNOSIS — Z841 Family history of disorders of kidney and ureter: Secondary | ICD-10-CM | POA: Insufficient documentation

## 2016-09-21 DIAGNOSIS — E1122 Type 2 diabetes mellitus with diabetic chronic kidney disease: Secondary | ICD-10-CM | POA: Diagnosis not present

## 2016-09-21 DIAGNOSIS — Z91041 Radiographic dye allergy status: Secondary | ICD-10-CM | POA: Insufficient documentation

## 2016-09-21 DIAGNOSIS — Z7984 Long term (current) use of oral hypoglycemic drugs: Secondary | ICD-10-CM | POA: Diagnosis not present

## 2016-09-21 DIAGNOSIS — N183 Chronic kidney disease, stage 3 (moderate): Secondary | ICD-10-CM | POA: Insufficient documentation

## 2016-09-21 HISTORY — PX: CYSTOSCOPY W/ RETROGRADES: SHX1426

## 2016-09-21 HISTORY — PX: CYSTOSCOPY WITH STENT PLACEMENT: SHX5790

## 2016-09-21 HISTORY — PX: URETEROSCOPY WITH HOLMIUM LASER LITHOTRIPSY: SHX6645

## 2016-09-21 LAB — GLUCOSE, CAPILLARY
GLUCOSE-CAPILLARY: 173 mg/dL — AB (ref 65–99)
Glucose-Capillary: 150 mg/dL — ABNORMAL HIGH (ref 65–99)

## 2016-09-21 SURGERY — CYSTOSCOPY, WITH RETROGRADE PYELOGRAM
Anesthesia: General | Laterality: Right | Wound class: Clean Contaminated

## 2016-09-21 MED ORDER — FAMOTIDINE 20 MG PO TABS
20.0000 mg | ORAL_TABLET | Freq: Once | ORAL | Status: AC
  Administered 2016-09-21: 20 mg via ORAL

## 2016-09-21 MED ORDER — MIDAZOLAM HCL 2 MG/2ML IJ SOLN
INTRAMUSCULAR | Status: DC | PRN
  Administered 2016-09-21: 1 mg via INTRAVENOUS

## 2016-09-21 MED ORDER — ACETAMINOPHEN 10 MG/ML IV SOLN
INTRAVENOUS | Status: DC | PRN
  Administered 2016-09-21: 1000 mg via INTRAVENOUS

## 2016-09-21 MED ORDER — LIDOCAINE HCL (CARDIAC) 20 MG/ML IV SOLN
INTRAVENOUS | Status: DC | PRN
  Administered 2016-09-21: 30 mg via INTRAVENOUS

## 2016-09-21 MED ORDER — ROCURONIUM BROMIDE 50 MG/5ML IV SOLN
INTRAVENOUS | Status: AC
  Filled 2016-09-21: qty 1

## 2016-09-21 MED ORDER — HYDROCODONE-ACETAMINOPHEN 5-325 MG PO TABS
ORAL_TABLET | ORAL | Status: AC
  Filled 2016-09-21: qty 1

## 2016-09-21 MED ORDER — ARTIFICIAL TEARS OP OINT
TOPICAL_OINTMENT | OPHTHALMIC | Status: AC
Start: 2016-09-21 — End: ?
  Filled 2016-09-21: qty 3.5

## 2016-09-21 MED ORDER — SODIUM CHLORIDE 0.9 % IV SOLN
INTRAVENOUS | Status: DC
  Administered 2016-09-21: 08:00:00 via INTRAVENOUS

## 2016-09-21 MED ORDER — HYDROCODONE-ACETAMINOPHEN 5-325 MG PO TABS
1.0000 | ORAL_TABLET | Freq: Four times a day (QID) | ORAL | Status: DC | PRN
  Administered 2016-09-21: 1 via ORAL

## 2016-09-21 MED ORDER — SUCCINYLCHOLINE CHLORIDE 20 MG/ML IJ SOLN
INTRAMUSCULAR | Status: DC | PRN
  Administered 2016-09-21: 100 mg via INTRAVENOUS

## 2016-09-21 MED ORDER — FENTANYL CITRATE (PF) 100 MCG/2ML IJ SOLN
INTRAMUSCULAR | Status: DC | PRN
  Administered 2016-09-21: 50 ug via INTRAVENOUS
  Administered 2016-09-21 (×2): 25 ug via INTRAVENOUS

## 2016-09-21 MED ORDER — SUGAMMADEX SODIUM 200 MG/2ML IV SOLN
INTRAVENOUS | Status: AC
  Filled 2016-09-21: qty 2

## 2016-09-21 MED ORDER — CEFAZOLIN SODIUM-DEXTROSE 2-4 GM/100ML-% IV SOLN
INTRAVENOUS | Status: AC
  Administered 2016-09-21: 2 g via INTRAVENOUS
  Filled 2016-09-21: qty 100

## 2016-09-21 MED ORDER — SUGAMMADEX SODIUM 200 MG/2ML IV SOLN
INTRAVENOUS | Status: DC | PRN
  Administered 2016-09-21: 200 mg via INTRAVENOUS

## 2016-09-21 MED ORDER — PROPOFOL 10 MG/ML IV BOLUS
INTRAVENOUS | Status: DC | PRN
  Administered 2016-09-21: 120 mg via INTRAVENOUS
  Administered 2016-09-21: 30 mg via INTRAVENOUS

## 2016-09-21 MED ORDER — FENTANYL CITRATE (PF) 100 MCG/2ML IJ SOLN: 25.0000 ug | INTRAMUSCULAR | Status: DC | PRN

## 2016-09-21 MED ORDER — DEXAMETHASONE SODIUM PHOSPHATE 10 MG/ML IJ SOLN
INTRAMUSCULAR | Status: DC | PRN
  Administered 2016-09-21: 10 mg via INTRAVENOUS

## 2016-09-21 MED ORDER — MIDAZOLAM HCL 2 MG/2ML IJ SOLN
INTRAMUSCULAR | Status: AC
  Filled 2016-09-21: qty 2

## 2016-09-21 MED ORDER — DEXAMETHASONE SODIUM PHOSPHATE 10 MG/ML IJ SOLN
INTRAMUSCULAR | Status: AC
  Filled 2016-09-21: qty 1

## 2016-09-21 MED ORDER — SEVOFLURANE IN SOLN
RESPIRATORY_TRACT | Status: AC
Start: 2016-09-21 — End: ?
  Filled 2016-09-21: qty 250

## 2016-09-21 MED ORDER — LIDOCAINE HCL (PF) 2 % IJ SOLN
INTRAMUSCULAR | Status: AC
  Filled 2016-09-21: qty 2

## 2016-09-21 MED ORDER — FAMOTIDINE 20 MG PO TABS
ORAL_TABLET | ORAL | Status: AC
  Administered 2016-09-21: 20 mg via ORAL
  Filled 2016-09-21: qty 1

## 2016-09-21 MED ORDER — ACETAMINOPHEN 10 MG/ML IV SOLN
INTRAVENOUS | Status: AC
Start: 2016-09-21 — End: ?
  Filled 2016-09-21: qty 100

## 2016-09-21 MED ORDER — FENTANYL CITRATE (PF) 100 MCG/2ML IJ SOLN
INTRAMUSCULAR | Status: AC
  Filled 2016-09-21: qty 2

## 2016-09-21 MED ORDER — ONDANSETRON HCL 4 MG/2ML IJ SOLN
INTRAMUSCULAR | Status: AC
  Filled 2016-09-21: qty 2

## 2016-09-21 MED ORDER — PROPOFOL 10 MG/ML IV BOLUS
INTRAVENOUS | Status: AC
  Filled 2016-09-21: qty 20

## 2016-09-21 MED ORDER — HYDROCODONE-ACETAMINOPHEN 5-325 MG PO TABS: 1.0000 | ORAL_TABLET | Freq: Four times a day (QID) | ORAL | 0 refills | Status: DC | PRN

## 2016-09-21 MED ORDER — ROCURONIUM BROMIDE 100 MG/10ML IV SOLN
INTRAVENOUS | Status: DC | PRN
  Administered 2016-09-21: 5 mg via INTRAVENOUS
  Administered 2016-09-21: 20 mg via INTRAVENOUS
  Administered 2016-09-21: 5 mg via INTRAVENOUS

## 2016-09-21 MED ORDER — ONDANSETRON HCL 4 MG/2ML IJ SOLN
INTRAMUSCULAR | Status: DC | PRN
  Administered 2016-09-21: 4 mg via INTRAVENOUS

## 2016-09-21 MED ORDER — TAMSULOSIN HCL 0.4 MG PO CAPS: 0.4000 mg | ORAL_CAPSULE | Freq: Every day | ORAL | 0 refills | Status: DC

## 2016-09-21 MED ORDER — ONDANSETRON HCL 4 MG/2ML IJ SOLN: 4.0000 mg | Freq: Once | INTRAMUSCULAR | Status: DC | PRN

## 2016-09-21 MED ORDER — IOTHALAMATE MEGLUMINE 43 % IV SOLN
INTRAVENOUS | Status: DC | PRN
  Administered 2016-09-21: 15 mL

## 2016-09-21 SURGICAL SUPPLY — 38 items
BAG DRAIN CYSTO-URO LG1000N (MISCELLANEOUS) ×5 IMPLANT
BASKET ZERO TIP 1.9FR (BASKET) ×5 IMPLANT
CATH URETL 5X70 OPEN END (CATHETERS) ×5 IMPLANT
CNTNR SPEC 2.5X3XGRAD LEK (MISCELLANEOUS) ×3
CONRAY 43 FOR UROLOGY 50M (MISCELLANEOUS) ×5 IMPLANT
CONT SPEC 4OZ STER OR WHT (MISCELLANEOUS) ×2
CONTAINER SPEC 2.5X3XGRAD LEK (MISCELLANEOUS) ×3 IMPLANT
DRAPE UTILITY 15X26 TOWEL STRL (DRAPES) ×5 IMPLANT
DRESSING TELFA 4X3 1S ST N-ADH (GAUZE/BANDAGES/DRESSINGS) ×5 IMPLANT
ELECT REM PT RETURN 9FT ADLT (ELECTROSURGICAL)
ELECTRODE REM PT RTRN 9FT ADLT (ELECTROSURGICAL) IMPLANT
FIBER LASER LITHO 273 (Laser) ×5 IMPLANT
GLOVE BIO SURGEON STRL SZ 6.5 (GLOVE) ×4 IMPLANT
GLOVE BIO SURGEONS STRL SZ 6.5 (GLOVE) ×1
GOWN STRL REUS W/ TWL LRG LVL3 (GOWN DISPOSABLE) ×6 IMPLANT
GOWN STRL REUS W/TWL LRG LVL3 (GOWN DISPOSABLE) ×4
GUIDEWIRE GREEN .038 145CM (MISCELLANEOUS) IMPLANT
GUIDEWIRE SUPER STIFF (WIRE) ×5 IMPLANT
INFUSOR MANOMETER BAG 3000ML (MISCELLANEOUS) ×5 IMPLANT
INTRODUCER DILATOR DOUBLE (INTRODUCER) IMPLANT
KIT RM TURNOVER CYSTO AR (KITS) ×5 IMPLANT
NDL SAFETY ECLIPSE 18X1.5 (NEEDLE) ×3 IMPLANT
NEEDLE HYPO 18GX1.5 SHARP (NEEDLE) ×2
PACK CYSTO AR (MISCELLANEOUS) ×5 IMPLANT
SCRUB POVIDONE IODINE 4 OZ (MISCELLANEOUS) ×5 IMPLANT
SENSORWIRE 0.038 NOT ANGLED (WIRE) ×5
SET CYSTO W/LG BORE CLAMP LF (SET/KITS/TRAYS/PACK) ×5 IMPLANT
SET IRRIG Y TYPE TUR BLADDER L (SET/KITS/TRAYS/PACK) ×5 IMPLANT
SHEATH URETERAL 12FRX35CM (MISCELLANEOUS) ×5 IMPLANT
SOL .9 NS 3000ML IRR  AL (IV SOLUTION) ×2
SOL .9 NS 3000ML IRR UROMATIC (IV SOLUTION) ×3 IMPLANT
STENT URET 6FRX24 CONTOUR (STENTS) ×5 IMPLANT
STENT URET 6FRX26 CONTOUR (STENTS) IMPLANT
SURGILUBE 2OZ TUBE FLIPTOP (MISCELLANEOUS) ×5 IMPLANT
SYRINGE IRR TOOMEY STRL 70CC (SYRINGE) ×5 IMPLANT
WATER STERILE IRR 1000ML POUR (IV SOLUTION) ×5 IMPLANT
WATER STERILE IRR 3000ML UROMA (IV SOLUTION) ×5 IMPLANT
WIRE SENSOR 0.038 NOT ANGLED (WIRE) ×3 IMPLANT

## 2016-09-21 NOTE — Anesthesia Post-op Follow-up Note (Cosign Needed)
Anesthesia QCDR form completed.        

## 2016-09-21 NOTE — Anesthesia Preprocedure Evaluation (Signed)
Anesthesia Evaluation  Patient identified by MRN, date of birth, ID band Patient awake    Reviewed: Allergy & Precautions, NPO status , Patient's Chart, lab work & pertinent test results, reviewed documented beta blocker date and time   Airway Mallampati: II       Dental   Pulmonary asthma ,    Pulmonary exam normal        Cardiovascular hypertension, Pt. on medications and Pt. on home beta blockers Normal cardiovascular exam     Neuro/Psych PSYCHIATRIC DISORDERS Anxiety Depression    GI/Hepatic GERD  Medicated and Controlled,  Endo/Other  diabetes, Well Controlled, Type 2, Oral Hypoglycemic AgentsHypothyroidism   Renal/GU Renal InsufficiencyRenal disease  negative genitourinary   Musculoskeletal  (+) Arthritis , Osteoarthritis,    Abdominal (+) + obese,   Peds negative pediatric ROS (+)  Hematology negative hematology ROS (+)   Anesthesia Other Findings   Reproductive/Obstetrics                             Anesthesia Physical Anesthesia Plan  ASA: III  Anesthesia Plan: General   Post-op Pain Management:    Induction: Intravenous  Airway Management Planned: Oral ETT  Additional Equipment:   Intra-op Plan:   Post-operative Plan: Extubation in OR  Informed Consent: I have reviewed the patients History and Physical, chart, labs and discussed the procedure including the risks, benefits and alternatives for the proposed anesthesia with the patient or authorized representative who has indicated his/her understanding and acceptance.     Plan Discussed with: CRNA and Surgeon  Anesthesia Plan Comments:         Anesthesia Quick Evaluation

## 2016-09-21 NOTE — H&P (View-Only) (Signed)
09/14/2016 3:57 PM   Brianna Cole 01/02/1941 [id number]  Referring provider: Lenard Simmer, MD [address] [place], [place] [address]  Chief Complaint  Patient presents with  . Results    CT    HPI: 76 yo WF who presents today to discuss the results of her non contrast CT scan for the evaluation of AHM.  (CT Urogram was not performed due to patient's allergy to contrast dye)  Microscopic hematuria She denies any gross hematuria.  UA with 11-30RBC's/hpf and urine culture was negative on 08/12/2016.  Non contrast CT performed on 09/09/2016 noted nonobstructing renal calculi.  No evidence of ureteral calculi, hydronephrosis, or other acute findings.  Aortic atherosclerosis.  I have independently reviewed the films.  There was an area in the right renal pelvis that appeared abnormal.  Her UA today demonstrates a 3-10 RBC's and > 30 WBC's with moderate bacteria.   History of nephrolithiasis Patient denies any flank pain, passage of fragments or gross hematuria. CT demonstrates stable bilateral renal calculi.  I have independently reviewed the films.    Urinary incontinence Patient currently on Myrbetriq 25 mg daily.  She is having a great deal of bother with accidental loss of small amounts of urine, daytime urination, waking up at night because she has to urinate and urine loss associated with a strong desire to urinate. She has quite a bit of bother with the sudden urge to urinate was little or no warning.  She is not bothered at all with uncomfortable urge to urinate.    Vaginal atrophy Patient using the vaginal estrogen cream intermittently.    PMH: Past Medical History:  Diagnosis Date  . Arthritis   . Asthma   . Cataract   . Chronic kidney disease   . Depression   . Diabetes mellitus without complication ([place])   . Environmental allergies    Followed by Dr. Donneta Romberg  . GERD (gastroesophageal reflux disease)   . Hyperlipidemia   . Hypertension   . Kidney stones   .  Thyroid disease   . Urinary incontinence     Surgical History: Past Surgical History:  Procedure Laterality Date  . ABDOMINAL HYSTERECTOMY  1974  . CHOLECYSTECTOMY  1995  . KIDNEY STONE SURGERY    . VAGINAL DELIVERY     2, 1x forceps delivery    Home Medications:  Allergies as of 09/14/2016      Reactions   Shellfish Allergy Hives, Swelling   Wheat Bran Other (See Comments)   Iodinated Diagnostic Agents Rash      Medication List       Accurate as of 09/14/16  3:57 PM. Always use your most recent med list.          buPROPion 150 MG 12 hr tablet Commonly known as:  WELLBUTRIN SR Take 150 mg by mouth 2 (two) times daily.   conjugated estrogens vaginal cream Commonly known as:  PREMARIN Place 1 Applicatorful vaginally daily. Apply 0.5mg  (pea-sized amount)  just inside the vaginal introitus with a finger-tip every night for two weeks and then Monday, Wednesday and Friday nights.   diltiazem 180 MG 24 hr capsule Commonly known as:  CARDIZEM CD Take 1 capsule (180 mg total) by mouth daily.   estradiol 0.1 MG/GM vaginal cream Commonly known as:  ESTRACE VAGINAL Place 1 Applicatorful vaginally at bedtime.   estradiol 0.1 MG/GM vaginal cream Commonly known as:  ESTRACE VAGINAL Apply 0.5mg  (pea-sized amount)  just inside the vaginal introitus with a  finger-tip every night for two weeks and then Monday, Wednesday and Friday nights.   fexofenadine 180 MG tablet Commonly known as:  ALLEGRA TAKE 1 TABLET BY MOUTH ONCE A DAY (OTC*)   fluticasone 50 MCG/ACT nasal spray Commonly known as:  FLONASE Place 2 sprays into the nose daily.   Fluticasone-Salmeterol 100-50 MCG/DOSE Aepb Commonly known as:  ADVAIR Inhale 1 puff into the lungs every 12 (twelve) hours.   gabapentin 300 MG capsule Commonly known as:  NEURONTIN Take 1 capsule (300 mg total) by mouth 3 (three) times daily.   lansoprazole 15 MG capsule Commonly known as:  PREVACID Take 15 mg by mouth daily.     metoprolol succinate 25 MG 24 hr tablet Commonly known as:  TOPROL-XL   mirabegron ER 25 MG Tb24 tablet Commonly known as:  MYRBETRIQ Take 1 tablet (25 mg total) by mouth daily.   montelukast 10 MG tablet Commonly known as:  SINGULAIR Take 10 mg by mouth at bedtime.   ONE TOUCH ULTRA TEST test strip Generic drug:  glucose blood   pravastatin 20 MG tablet Commonly known as:  PRAVACHOL Take 20 mg by mouth daily.   PROAIR HFA 108 (90 Base) MCG/ACT inhaler Generic drug:  albuterol Inhale 2 puffs into the lungs every 6 (six) hours as needed for wheezing.   sertraline 100 MG tablet Commonly known as:  ZOLOFT Take 100 mg by mouth daily.   SYNTHROID 75 MCG tablet Generic drug:  levothyroxine 1 BY MOUTH EVERY MORNING , DO NOT SUBSTITUTE PLEASE.Marland Kitchen REPLACESDISCONTINUED LEVOXYL   TRADJENTA 5 MG Tabs tablet Generic drug:  linagliptin Take 5 mg by mouth daily.   traZODone 50 MG tablet Commonly known as:  DESYREL   Vitamin D (Ergocalciferol) 50000 units Caps capsule Commonly known as:  DRISDOL Take 50,000 Units by mouth every 7 (seven) days.   zoledronic acid 5 MG/100ML Soln injection Commonly known as:  RECLAST       Allergies:  Allergies  Allergen Reactions  . Shellfish Allergy Hives and Swelling  . Wheat Bran Other (See Comments)  . Iodinated Diagnostic Agents Rash    Family History: Family History  Problem Relation Age of Onset  . Arthritis Mother   . Hyperlipidemia Mother   . Hypertension Mother   . Kidney disease Mother   . Diabetes Mother   . Cancer Mother     colon  . Cancer Father     lung  . Bladder Cancer Neg Hx   . Kidney cancer Neg Hx   . Prostate cancer Neg Hx     Social History:  reports that she has never smoked. She has never used smokeless tobacco. She reports that she does not drink alcohol or use drugs.  ROS: UROLOGY Frequent Urination?: No Hard to postpone urination?: No Burning/pain with urination?: No Get up at night to urinate?:  No Leakage of urine?: No Urine stream starts and stops?: No Trouble starting stream?: No Do you have to strain to urinate?: No Blood in urine?: No Urinary tract infection?: No Sexually transmitted disease?: No Injury to kidneys or bladder?: No Painful intercourse?: No Weak stream?: No Currently pregnant?: No Vaginal bleeding?: No Last menstrual period?: n  Gastrointestinal Nausea?: No Vomiting?: No Indigestion/heartburn?: No Diarrhea?: No Constipation?: Yes  Constitutional Fever: No Night sweats?: No Weight loss?: No Fatigue?: No  Skin Skin rash/lesions?: No Itching?: No  Eyes Blurred vision?: No Double vision?: No  Ears/Nose/Throat Sore throat?: No Sinus problems?: No  Hematologic/Lymphatic Swollen glands?: No Easy  bruising?: No  Cardiovascular Leg swelling?: No Chest pain?: No  Respiratory Cough?: No Shortness of breath?: Yes  Endocrine Excessive thirst?: No  Musculoskeletal Back pain?: Yes Joint pain?: Yes  Neurological Headaches?: No Dizziness?: Yes  Psychologic Depression?: Yes Anxiety?: Yes  Physical Exam: BP (!) 151/72   Pulse 62   Ht 5\' 1"  (1.549 m)   Wt 197 lb 11.2 oz (89.7 kg)   BMI 37.36 kg/m   Constitutional: Well nourished. Alert and oriented, No acute distress. HEENT: [place] AT, moist mucus membranes. Trachea midline, no masses. Cardiovascular: No clubbing, cyanosis, or edema. Respiratory: Normal respiratory effort, no increased work of breathing. GI: Abdomen is soft, non tender, non distended, no abdominal masses. Liver and spleen not palpable.  No hernias appreciated.  Stool sample for occult testing is not indicated.   GU: No CVA tenderness.  No bladder fullness or masses.   Skin: No rashes, bruises or suspicious lesions. Lymph: No cervical or inguinal adenopathy. Neurologic: Grossly intact, no focal deficits, moving all 4 extremities. Psychiatric: Normal mood and affect.  Laboratory Data:  Lab Results  Component  Value Date   CREATININE 1.3 (H) 10/09/2012    Lab Results  Component Value Date   HGBA1C 5.2 10/09/2012    Urinalysis > 30 WBC's.  11 - 30 RBC's/hpf.  Moderate bacteria. See EPIC.  Pertinent imaging CLINICAL DATA:  Right flank pain. Microscopic hematuria. Nephrolithiasis. Worsening urinary incontinence.  EXAM: CT ABDOMEN AND PELVIS WITHOUT CONTRAST  TECHNIQUE: Multidetector CT imaging of the abdomen and pelvis was performed following the standard protocol without IV contrast.  COMPARISON:  08/01/2007  FINDINGS: Lower chest: No acute findings.  Hepatobiliary: No masses visualized on this unenhanced exam. Prior cholecystectomy noted. No evidence of biliary dilatation.  Pancreas: No mass or inflammatory process visualized on this unenhanced exam.  Spleen:  Within normal limits in size.  Adrenals/Urinary tract: 2 right renal calculi are seen, largest in the upper pole measuring 11 mm. A tiny 1-2 mm calculus is seen in the upper pole of the left kidney. No evidence of ureteral calculi or hydronephrosis. Unopacified urinary bladder is unremarkable in appearance.  Stomach/Bowel: No evidence of obstruction, inflammatory process, or abnormal fluid collections. Normal appendix visualized.  Vascular/Lymphatic: No pathologically enlarged lymph nodes identified. No evidence of abdominal aortic aneurysm. Aortic atherosclerosis.  Reproductive: Prior hysterectomy noted. Adnexal regions are unremarkable in appearance.  Other:  None.  Musculoskeletal:  No suspicious bone lesions identified.  IMPRESSION: Nonobstructing renal calculi. No evidence of ureteral calculi, hydronephrosis, or other acute findings.  Aortic atherosclerosis.   Electronically Signed   By: Earle Gell M.D.   On: 09/09/2016 13:20   Assessment & Plan:   Patient will undergo cystoscopy with bilateral retrogrades with right ureteroscopy with right laser lithotripsy with right  ureteral stent placement for an abnormal appearing right renal pelvis on CT and two right renal stones, largest 11 mm in size.    1. Right renal stones  - schedule right ureteroscopy with laser lithotripsy and ureteral stent placement  - explained to the patient how the procedure is performed and the risks involved  - informed patient that they will have a stent placed during the procedure and will remain in place after the procedure for a short time.   - stent may be removed in the office with a cystoscope or patient may be instructed to remove the stent themselves by the string  - described "stent pain" as feelings of needing to urinate/overactive bladder and a warm,  tingling sensation to intense pain in the affected flank  - residual stones within the kidney or ureter may be present after the procedure and may need to have these addressed at a different encounter  - injury to the ureter is the most common intra-operative risk, it may result in an open procedure to correct the defect  - infection and bleeding are also risks  - explained the risks of general anesthesia, such as: MI, CVA, paralysis, coma and/or death.  - UA  - urine culture  - CBC  - BMP  - advised to contact our office or seek treatment in the ED if becomes febrile or pain/ vomiting are difficult control in order to arrange for emergent/urgent intervention  2. Abnormal appearance in right renal pelvis seen on CT  - patient will undergo bilateral retrogrades, right ureteroscopy with possible biopsy at the same time of ureteroscopic extraction of her right renal stones  - risks explained  - see above  3. Microscopic hematuria  - patient will undergo cystoscopy with bilateral retrogrades at the time of her ureteroscopic stone extraction to complete hematuria workup  - see above  3. Incontinence:   Patient will continue Myrbetriq 25 mg daily.    She will return in 1 year for symptom recheck and PVR.  4. Atrophic  vaginitis  - Patient will apply the vaginal estrogen cream 3 nights weekly  - She will return in 12 months for exam  5. Kidney stones: Patient will undergo right ureteroscopy with laser lithotripsy with ureteral stent placement for definitive treatment of her right renal stones.  Possible punctate left renal stone.    Return for cystoscopy, RTG's, ureteroscopy with possible biopsy, URS/LL/ureteral stent placement.  Zara Council, [facility] [address], [place], [place] [address] [phone]

## 2016-09-21 NOTE — Anesthesia Procedure Notes (Signed)
Procedure Name: Intubation Date/Time: 09/21/2016 9:54 AM Performed by: Johnna Acosta Pre-anesthesia Checklist: Patient identified, Emergency Drugs available, Suction available, Patient being monitored and Timeout performed Patient Re-evaluated:Patient Re-evaluated prior to inductionOxygen Delivery Method: Circle system utilized Preoxygenation: Pre-oxygenation with 100% oxygen Intubation Type: IV induction Ventilation: Mask ventilation without difficulty Laryngoscope Size: Glidescope and 3 Grade View: Grade II Tube type: Oral Tube size: 7.0 mm Number of attempts: 1 Airway Equipment and Method: Stylet and Video-laryngoscopy Placement Confirmation: ETT inserted through vocal cords under direct vision,  positive ETCO2 and breath sounds checked- equal and bilateral Secured at: 20 cm Tube secured with: Tape Dental Injury: Teeth and Oropharynx as per pre-operative assessment  Difficulty Due To: Difficulty was anticipated, Difficult Airway- due to anterior larynx, Difficult Airway- due to reduced neck mobility and Difficult Airway- due to limited oral opening Future Recommendations: Recommend- induction with short-acting agent, and alternative techniques readily available

## 2016-09-21 NOTE — OR Nursing (Signed)
Pt ambulated to room 16 without difficulty Left eye swollen, bruised and old scab above left eye. Pt stated [facility] and [facility]ed.  Both knees scabbed. Pt stated that she falls frequently. Call light given to pt and both side rails up. Pt instructed to call before getting off the stretcher.

## 2016-09-21 NOTE — Anesthesia Postprocedure Evaluation (Signed)
Anesthesia Post Note  Patient: Brianna Cole  Procedure(s) Performed: Procedure(s) (LRB): CYSTOSCOPY WITH RETROGRADE PYELOGRAM (Bilateral) URETEROSCOPY WITH HOLMIUM LASER LITHOTRIPSY (Right) CYSTOSCOPY WITH STENT PLACEMENT (Right)  Patient location during evaluation: PACU Anesthesia Type: General Level of consciousness: awake and alert and oriented Pain management: pain level controlled Vital Signs Assessment: post-procedure vital signs reviewed and stable Respiratory status: spontaneous breathing Cardiovascular status: blood pressure returned to baseline Anesthetic complications: no     Last Vitals:  Vitals:   09/21/16 1325 09/21/16 1412  BP: (!) 171/73 (!) 184/97  Pulse: 70 78  Resp: 16 16  Temp: 36.7 C     Last Pain:  Vitals:   09/21/16 1412  TempSrc:   PainSc: 5                  Jayana Kotula

## 2016-09-21 NOTE — Op Note (Signed)
Date of procedure: 09/21/16  Preoperative diagnosis:  1. Microscopic hematuria 2. Nephrolithiasis  Postoperative diagnosis:  1. same   Procedure: 1. Cystoscopy 2. Bilateral retrograde pyelogram 3. Right ureteroscopy 4. Laser lithotripsy 5. Basket extraction of right ureteral stent Stone fragment 6. Right ureteral stent placement  Surgeon: Hollice Espy, MD  Anesthesia: General  Complications: None  Intraoperative findings: Normal left retrograde. 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  Large 11 mm right upper pole stone as well as nonobstructing right mid pole stones. Nonspecific bladder erythema.  EBL: Minimal  Specimens: Stone fragments  Drains: 6 x 24 French double-J ureteral stent on right  Indication: Brianna Cole is a 76 y.o. patient with microscopic hematuria and nephrolithiasis was unable to undergo CT urogram due to contrast dye allergy. She had a noncontrast CT scan which showed significant right-sided stone burden and irregular appearance of the right renal pelvis.  After reviewing the management options for treatment, she elected to proceed with the above surgical procedure(s). We have discussed the potential benefits and risks of the procedure, side effects of the proposed treatment, the likelihood of the patient achieving the goals of the procedure, and any potential problems that might occur during the procedure or recuperation. Informed consent has been obtained.  Description of procedure:  The patient was taken to the operating room and general anesthesia was induced.  The patient was placed in the dorsal lithotomy position, prepped and draped in the usual sterile fashion, and preoperative antibiotics were administered. A preoperative time-out was performed.   A 21 French cystoscope was advanced per urethra into the bladder. The bladder was carefully inspected which revealed some trigonitis as  well as nonspecific erythema diffusely within the bladder. Of note, she been briefly diagnosed with UTI and is currently on antibiotics for this which is consistent with the appearance. There is no papillary tumor appreciated. Her graft attention was then turned to the left ureteral orifice which cannulated using a 5 [place] open-ended ureteral catheter. A gentle retrograde pyelogram was performed on this side which revealed a decompressed ureter and upper tract collecting system without hydroureteronephrosis. No filling defects were appreciated.  Attention was then turned to the right ureteral orifice and retrograde pyelogram was performed on this side using the aforementioned described technique. This revealed a decompressed ureter without filling defects but there was a slight narrowing at the UPJ and mild right renal pelvic fullness without overt hydronephrosis. There is no obvious filling defects on the side. The wire was then placed up to level of the kidney on the side. A second Super Stiff wire was then also placed to be used as a working wire. A Cook 12/14 ureteral access sheath was then advanced easily up to level of the proximal ureter and the inner cannula was removed. A dual lumen 8 [place] Wolf ureteroscope was then advanced through the access sheath into the renal pelvis. Formal pyeloscopy was then performed which revealed no tumors or renal pelvic abnormalities. Each of the calyces were then directly visualized. There was a very large stone identified within the right upper pole as well as a smaller nonobstructing stone in the mid pole with a few adjacent fragments. A 1.9 [place] basket was used to extract the smaller fragments. A 273  laser fiber was then brought in and using various settings including 0.2 and 40 Hz followed by 8 J and 15 Hz, the stone was fragmented into a smaller pieces. The pieces  were then extracted using a basket. This occurred over greater than 1 hour and care was taken to  meticulously clear the collecting system. There was some stone fragment which effluxed in the upper pole into a stenotic infundibulum was difficult to access. Ultimately, the majority if not all of any significant stone burden was removed. A second retrograde pyelogram was performed creating a roadmap of the kidney. There is no extravasation noted. Each and every calyx was strictly identified. There was good amount of stone debris and dust like material. At this time, the scope was backed down the length of the ureter inspecting along the way at which time no ureteral injuries, tumors, or stone fragments were identified. A 6 x 24 French double-J ureteral stent was then advanced over the safety wire up to level of the renal pelvis. The wire was partially drawn until full coil was noted within the renal pelvis. The wire was then fully withdrawn and a full coil was noted within the bladder. The bladder was then drained. The patient was then cleaned and dried, repositioned supine position, reversed from anesthesia, taken to PACU in stable condition.  Plan: Patient will return to the office in 2 weeks for cystoscopy, stent removal. Given the fair amount of stone debris: Leave the stent in for better longer than normal. Findings were discussed today with the patient's family.  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 Such, we will recheck her BMP at the time of stent removal. Additionally, given the nonspecific bladder erythema likely related to her recent UTI, I have recommended repeat cystoscopy in 2-3 months following stent removal to reassess.  Hollice Espy, M.D.

## 2016-09-21 NOTE — Interval H&P Note (Signed)
History and Physical Interval Note:  09/21/2016 8:31 AM  Brianna Cole  has presented today for surgery, with the diagnosis of NEPHROLITHIASIS RIGHT  The various methods of treatment have been discussed with the patient and family. After consideration of risks, benefits and other options for treatment, the patient has consented to  Procedure(s): CYSTOSCOPY WITH RETROGRADE PYELOGRAM (Bilateral) URETEROSCOPY WITH HOLMIUM LASER LITHOTRIPSY (Right) CYSTOSCOPY WITH BIOPSY (N/A) CYSTOSCOPY WITH STENT PLACEMENT (Right) as a surgical intervention .  The patient's history has been reviewed, patient examined, no change in status, stable for surgery.  I have reviewed the patient's chart and labs.  Questions were answered to the patient's satisfaction.    RRR CTAB  +UCx, being treat with appropriate abx  Hollice Espy

## 2016-09-21 NOTE — Discharge Instructions (Signed)
You have a ureteral stent in place.  This is a tube that extends from your kidney to your bladder.  This may cause urinary bleeding, burning with urination, and urinary frequency.  Please call our office or present to the ED if you develop fevers >101 or pain which is not able to be controlled with oral pain medications.  You may be given either Flomax and/ or ditropan to help with bladder spasms and stent pain in addition to pain medications.   ° °[facility] °[address], [address] °[place], [place] [address] °[phone] ° ° ° °AMBULATORY SURGERY  °DISCHARGE INSTRUCTIONS ° ° °1) The drugs that you were given will stay in your system until tomorrow so for the next 24 hours you should not: ° °A) Drive an automobile °B) Make any legal decisions °C) Drink any alcoholic beverage ° ° °2) You may resume regular meals tomorrow.  Today it is better to start with liquids and gradually work up to solid foods. ° °You may eat anything you prefer, but it is better to start with liquids, then soup and crackers, and gradually work up to solid foods. ° ° °3) Please notify your doctor immediately if you have any unusual bleeding, trouble breathing, redness and pain at the surgery site, drainage, fever, or pain not relieved by medication. ° ° ° °4) Additional Instructions: ° ° ° ° ° ° ° °Please contact your physician with any problems or Same Day Surgery at [phone], Monday through Friday 6 am to 4 pm, or [place] [facility] [place] number at [phone]. °

## 2016-09-21 NOTE — Transfer of Care (Signed)
Immediate Anesthesia Transfer of Care Note  Patient: Ruie Kathee Polite  Procedure(s) Performed: Procedure(s): CYSTOSCOPY WITH RETROGRADE PYELOGRAM (Bilateral) URETEROSCOPY WITH HOLMIUM LASER LITHOTRIPSY (Right) CYSTOSCOPY WITH STENT PLACEMENT (Right)  Patient Location: PACU  Anesthesia Type:General  Level of Consciousness: awake  Airway & Oxygen Therapy: Patient Spontanous Breathing and Patient connected to face mask oxygen  Post-op Assessment: Report given to RN and Post -op Vital signs reviewed and stable  Post vital signs: Reviewed and stable  Last Vitals:  Vitals:   09/21/16 0757  BP: (!) 140/48  Pulse: (!) 55  Resp: 20  Temp: 36.6 C    Last Pain:  Vitals:   09/21/16 0757  TempSrc: Tympanic      Patients Stated Pain Goal: 2 (83/15/17 6160)  Complications: No apparent anesthesia complications

## 2016-09-22 ENCOUNTER — Encounter: Payer: Self-pay | Admitting: Urology

## 2016-10-01 LAB — STONE ANALYSIS
CA PHOS CRY STONE QL IR: 20 %
Ca Oxalate,Dihydrate: 30 %
Ca Oxalate,Monohydr.: 50 %
STONE WEIGHT KSTONE: 102.7 mg

## 2016-10-05 ENCOUNTER — Encounter: Payer: Self-pay | Admitting: Urology

## 2016-10-05 ENCOUNTER — Ambulatory Visit (INDEPENDENT_AMBULATORY_CARE_PROVIDER_SITE_OTHER): Payer: PPO | Admitting: Urology

## 2016-10-05 VITALS — BP 111/70 | HR 102 | Ht 62.0 in | Wt 182.6 lb

## 2016-10-05 DIAGNOSIS — N3000 Acute cystitis without hematuria: Secondary | ICD-10-CM

## 2016-10-05 DIAGNOSIS — Q6239 Other obstructive defects of renal pelvis and ureter: Secondary | ICD-10-CM

## 2016-10-05 DIAGNOSIS — N183 Chronic kidney disease, stage 3 unspecified: Secondary | ICD-10-CM

## 2016-10-05 DIAGNOSIS — N200 Calculus of kidney: Secondary | ICD-10-CM

## 2016-10-05 DIAGNOSIS — Q6211 Congenital occlusion of ureteropelvic junction: Secondary | ICD-10-CM

## 2016-10-05 MED ORDER — SULFAMETHOXAZOLE-TRIMETHOPRIM 800-160 MG PO TABS: 1.0000 | ORAL_TABLET | Freq: Two times a day (BID) | ORAL | 0 refills | Status: DC

## 2016-10-05 NOTE — Progress Notes (Signed)
In and Out Catheterization  Patient is present today for a I & O catheterization due to unable to obtain specimen. Patient was cleaned and prepped in a sterile fashion with betadine and Lidocaine 2% jelly was instilled into the urethra.  A 14FR cath was inserted no complications were noted , 40ml of urine return was noted, urine was red in color. A clean urine sample was collected for UA, UCX. Bladder was drained  And catheter was removed with out difficulty.    Preformed by: Elberta Leatherwood, CMA

## 2016-10-05 NOTE — Progress Notes (Signed)
10/05/2016 5:20 PM   Brianna Cole Jul 31, 1940 [id number]  Referring provider: Lenard Simmer, MD [address] [place], [place] [address]  Chief Complaint  Patient presents with  . Cysto Stent Removal    HPI: 76 year old female who presents today for cystoscopy, stent removal following right ureteroscopy for treatment of a large 11 mm upper pole stone.  Operatively, retrograde pyelogram with concerning for very mild UPJ obstruction with right renal pelvic fullness but no overt hydronephrosis and the scope was able to be advanced easily. She was otherwise asymptomatic from this. She does have baseline chronic kidney disease, stage III and creatinine was repeated today to assess whether or not there was some degree of possible obstructive component to her chronic kidney disease.  Her preoperative urine culture was positive and she was treated with insulin prior to surgery. Today, her UA is highly suspicious for recurrent versus ongoing infection, positive leukocyte Estrace and nitrites.  She does report today that she is fatigued and has some pressure from the stent but otherwise no fevers, chills, dysuria, frequency, or urgency.   PMH: Past Medical History:  Diagnosis Date  . Anxiety   . Arthritis   . Asthma   . Cataract   . Chronic kidney disease   . Depression   . Diabetes mellitus without complication ([place])   . Environmental allergies    Followed by Dr. Donneta Romberg  . GERD (gastroesophageal reflux disease)   . History of IBS   . History of kidney stones   . Hyperlipidemia   . Hypertension   . Kidney stones   . Thyroid disease   . Urinary incontinence     Surgical History: Past Surgical History:  Procedure Laterality Date  . ABDOMINAL HYSTERECTOMY  1974  . CHOLECYSTECTOMY  1995  . CYSTOSCOPY W/ RETROGRADES Bilateral 09/21/2016   Procedure: CYSTOSCOPY WITH RETROGRADE PYELOGRAM;  Surgeon: Hollice Espy, MD;  Location: [facility] ORS;  Service: Urology;  Laterality:  Bilateral;  . CYSTOSCOPY WITH STENT PLACEMENT Right 09/21/2016   Procedure: CYSTOSCOPY WITH STENT PLACEMENT;  Surgeon: Hollice Espy, MD;  Location: [facility] OR[facility];  Service: Urology;  Laterality: Right;  . [place]  . NASAL SINUS SURGERY    . URETEROSCOPY WITH HOLMIUM LASER LITHOTRIPSY Right 09/21/2016   Procedure: URETEROSCOPY WITH HOLMIUM LASER LITHOTRIPSY;  Surgeon: Hollice Espy, MD;  Location: [facility] OR[facility];  Service: Urology;  Laterality: Right;  Marland Kitchen VAGINAL DELIVERY     2, 1x forceps delivery    Home Medications:  Allergies as of 10/05/2016      Reactions   Shellfish Allergy Hives, Swelling   Wheat Bran Other (See Comments)   Iodinated Diagnostic Agents Rash      Medication List       Accurate as of 10/05/16  5:20 PM. Always use your most recent med list.          buPROPion 150 MG 12 hr tablet Commonly known as:  WELLBUTRIN SR Take 150 mg by mouth 2 (two) times daily.   conjugated estrogens vaginal cream Commonly known as:  PREMARIN Place 1 Applicatorful vaginally daily. Apply 0.5mg  (pea-sized amount)  just inside the vaginal introitus with a finger-tip every night for two weeks and then Monday, Wednesday and Friday nights.   diltiazem 180 MG 24 hr capsule Commonly known as:  CARDIZEM CD Take 1 capsule (180 mg total) by mouth daily.   estradiol 0.1 MG/GM vaginal cream Commonly known as:  ESTRACE VAGINAL Apply 0.5mg  (pea-sized amount)  just inside the vaginal introitus with a finger-tip every night for two weeks and then Monday, Wednesday and Friday nights.   fexofenadine 180 MG tablet Commonly known as:  ALLEGRA Take 180 mg by mouth daily as needed for allergies or rhinitis.   fluticasone 50 MCG/ACT nasal spray Commonly known as:  FLONASE Place 1-2 sprays into both nostrils daily as needed for allergies or rhinitis.   Fluticasone-Salmeterol 100-50 MCG/DOSE Aepb Commonly known as:  ADVAIR Inhale 1 puff into the lungs 2 (two) times daily as needed  (for wheezing (respiratory issues)).   gabapentin 300 MG capsule Commonly known as:  NEURONTIN Take 1 capsule (300 mg total) by mouth 3 (three) times daily.   HYDROcodone-acetaminophen 5-325 MG tablet Commonly known as:  NORCO/VICODIN Take 1-2 tablets by mouth every 6 (six) hours as needed for moderate pain.   lansoprazole 15 MG capsule Commonly known as:  PREVACID Take 15 mg by mouth at bedtime.   metoprolol succinate 25 MG 24 hr tablet Commonly known as:  TOPROL-XL Take 25 mg by mouth daily.   mirabegron ER 25 MG Tb24 tablet Commonly known as:  MYRBETRIQ Take 1 tablet (25 mg total) by mouth daily.   ONE TOUCH ULTRA TEST test strip Generic drug:  glucose blood 1 each by Other route See admin instructions. Up to twice daily (scheduled checks every morning)   pravastatin 20 MG tablet Commonly known as:  PRAVACHOL Take 20 mg by mouth at bedtime.   PROAIR HFA 108 (90 Base) MCG/ACT inhaler Generic drug:  albuterol Inhale 2 puffs into the lungs every 6 (six) hours as needed for wheezing.   sertraline 100 MG tablet Commonly known as:  ZOLOFT Take 100 mg by mouth daily.   sulfamethoxazole-trimethoprim 800-160 MG tablet Commonly known as:  BACTRIM DS,SEPTRA DS Take 1 tablet by mouth every 12 (twelve) hours.   SYNTHROID 75 MCG tablet Generic drug:  levothyroxine 1 BY MOUTH EVERY MORNING , DO NOT SUBSTITUTE PLEASE.Marland Kitchen REPLACESDISCONTINUED LEVOXYL   SYSTANE OP Place 1-2 drops into both eyes 3 (three) times daily as needed (for dry eyes).   tamsulosin 0.4 MG Caps capsule Commonly known as:  FLOMAX Take 1 capsule (0.4 mg total) by mouth daily.   TRADJENTA 5 MG Tabs tablet Generic drug:  linagliptin Take 5 mg by mouth daily.   traZODone 50 MG tablet Commonly known as:  DESYREL Take 50 mg by mouth at bedtime.   Vitamin D (Ergocalciferol) 50000 units Caps capsule Commonly known as:  DRISDOL Take 50,000 Units by mouth every Saturday.   zoledronic acid 5 MG/100ML Soln  injection Commonly known as:  RECLAST       Allergies:  Allergies  Allergen Reactions  . Shellfish Allergy Hives and Swelling  . Wheat Bran Other (See Comments)  . Iodinated Diagnostic Agents Rash    Family History: Family History  Problem Relation Age of Onset  . Arthritis Mother   . Hyperlipidemia Mother   . Hypertension Mother   . Kidney disease Mother   . Diabetes Mother   . Cancer Mother     colon  . Cancer Father     lung  . Bladder Cancer Neg Hx   . Kidney cancer Neg Hx   . Prostate cancer Neg Hx     Social History:  reports that she has never smoked. She has never used smokeless tobacco. She reports that she does not drink alcohol or use drugs.  ROS: ROS otherwise negative  Physical Exam: BP 111/70 (BP Location:  Left Arm, Patient Position: Sitting, Cuff Size: Normal)   Pulse (!) 102   Ht 5\' 2"  (1.575 m)   Wt 182 lb 9.6 oz (82.8 kg)   BMI 33.40 kg/m   Constitutional:  Alert and oriented, No acute distress. HEENT: [place] AT, moist mucus membranes.  Trachea midline, no masses. Cardiovascular: No clubbing, cyanosis, or edema. Respiratory: Normal respiratory effort, no increased work of breathing. GI: Abdomen is soft, nontender, nondistended, no abdominal masses GU: No CVA tenderness.  Skin: No rashes, bruises or suspicious lesions. Neurologic: Grossly intact, no focal deficits, moving all 4 extremities. Psychiatric: Normal mood and affect.  Laboratory Data: Lab Results  Component Value Date   WBC 8.0 09/14/2016   HCT 37.3 09/14/2016   MCV 93 09/14/2016   PLT 218 09/14/2016    Lab Results  Component Value Date   CREATININE 1.31 (H) 09/14/2016     Lab Results  Component Value Date   HGBA1C 5.2 10/09/2012    Urinalysis UA reviewed, positive for leukocytes, bacteria, and nitrates concerning for infection. See Epic for details.  She was unable to void enough for urine culture today, she will return tomorrow to provide this.'  Pertinent  Imaging: n/a  Assessment & Plan:    1. Kidney stones 11 mm RUP s/p URS Stent NOT removed today in setting of probable infection - Urinalysis, Complete - Basic metabolic panel - CULTURE, URINE COMPREHENSIVE  2. Acute cystitis without hematuria Bactrim DS bid x 7 days UCx pending, will bring more urine tomorrow AM  3. Stage 3 chronic kidney disease BMP to today to assess for possible improvement in renal function with stent  4. 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 [place], [place] [address] [phone]

## 2016-10-06 ENCOUNTER — Other Ambulatory Visit: Payer: Self-pay

## 2016-10-06 ENCOUNTER — Other Ambulatory Visit: Payer: PPO

## 2016-10-06 DIAGNOSIS — N200 Calculus of kidney: Secondary | ICD-10-CM

## 2016-10-06 LAB — BASIC METABOLIC PANEL
BUN/Creatinine Ratio: 12 (ref 12–28)
BUN: 21 mg/dL (ref 8–27)
CALCIUM: 10.1 mg/dL (ref 8.7–10.3)
CO2: 18 mmol/L (ref 18–29)
CREATININE: 1.81 mg/dL — AB (ref 0.57–1.00)
Chloride: 96 mmol/L (ref 96–106)
GFR, EST AFRICAN AMERICAN: 31 mL/min/{1.73_m2} — AB (ref >59)
GFR, EST NON AFRICAN AMERICAN: 27 mL/min/{1.73_m2} — AB (ref >59)
Glucose: 124 mg/dL — ABNORMAL HIGH (ref 65–99)
Potassium: 3.9 mmol/L (ref 3.5–5.2)
Sodium: 138 mmol/L (ref 134–144)

## 2016-10-12 DIAGNOSIS — J3081 Allergic rhinitis due to animal (cat) (dog) hair and dander: Secondary | ICD-10-CM | POA: Diagnosis not present

## 2016-10-12 DIAGNOSIS — J301 Allergic rhinitis due to pollen: Secondary | ICD-10-CM | POA: Diagnosis not present

## 2016-10-12 DIAGNOSIS — J3089 Other allergic rhinitis: Secondary | ICD-10-CM | POA: Diagnosis not present

## 2016-10-12 LAB — CULTURE, URINE COMPREHENSIVE

## 2016-10-13 ENCOUNTER — Ambulatory Visit: Payer: PPO | Admitting: Urology

## 2016-10-13 ENCOUNTER — Encounter: Payer: Self-pay | Admitting: Urology

## 2016-10-13 VITALS — BP 121/85 | HR 65 | Ht 62.0 in | Wt 186.7 lb

## 2016-10-13 DIAGNOSIS — N200 Calculus of kidney: Secondary | ICD-10-CM | POA: Diagnosis not present

## 2016-10-13 LAB — URINALYSIS, COMPLETE
BILIRUBIN UA: NEGATIVE
Glucose, UA: NEGATIVE
Nitrite, UA: NEGATIVE
PH UA: 5.5 (ref 5.0–7.5)
Specific Gravity, UA: 1.02 (ref 1.005–1.030)
Urobilinogen, Ur: 1 mg/dL (ref 0.2–1.0)

## 2016-10-13 LAB — MICROSCOPIC EXAMINATION
RBC, UA: >30 /hpf — ABNORMAL HIGH (ref 0–?)
WBC, UA: >30 /hpf — ABNORMAL HIGH (ref 0–?)

## 2016-10-13 MED ORDER — LIDOCAINE HCL 2 % EX GEL
1.0000 "application " | Freq: Once | CUTANEOUS | Status: AC
  Administered 2016-10-13: 1 via URETHRAL

## 2016-10-13 MED ORDER — CIPROFLOXACIN HCL 500 MG PO TABS
500.0000 mg | ORAL_TABLET | Freq: Once | ORAL | Status: AC
  Administered 2016-10-13: 500 mg via ORAL

## 2016-10-13 NOTE — Progress Notes (Signed)
   10/13/16  CC:  Chief Complaint  Patient presents with  . Cysto Stent Removal    Renal Stone     HPI: 76 year old female who presents today for cystoscopy, stent removal following right ureteroscopy for treatment of a large 11 mm upper pole stone.  Intraoperatively, retrograde pyelogram with concerning for very mild UPJ obstruction with right renal pelvic fullness but no overt hydronephrosis and the scope was able to be advanced easily. She was otherwise asymptomatic from this. She does have baseline chronic kidney disease, stage III and creatinine was repeated after stent had been in place and was 1.8. This is actually slightly higher than baseline of 1.4.  Her stent was not removed at appoinment last week due to concern for infection but urine culture was negative.  There were no vitals taken for this visit. NED. A&Ox3.   No respiratory distress   Abd soft, NT, ND Normal external genitalia with patent urethral meatus  Cystoscopy Procedure Note  Patient identification was confirmed, informed consent was obtained, and patient was prepped using Betadine solution.  Lidocaine jelly was administered per urethral meatus.    Preoperative abx where received prior to procedure.    Procedure: - Flexible cystoscope introduced, without any difficulty.   - Thorough search of the bladder revealed:    normal urethral meatus  -Right ureteral stent removed with flexible graspers and tach per meatus  Post-Procedure: - Patient tolerated the procedure well  Assessment/ Plan:  1. Kidney stones 11 mm RUP s/p URS -follow up in one month with renal u/s prior to r/o iatrogenic hydronephrosis (will need to account for mild right UPJ obstruction at baesline) -will discuss stone analysis at that time  2. Stage 3 chronic kidney disease No improvement in Cr with stent in place.   3. 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 Will monitor conservatively for now   Nickie Retort, MD

## 2016-10-19 DIAGNOSIS — J301 Allergic rhinitis due to pollen: Secondary | ICD-10-CM | POA: Diagnosis not present

## 2016-10-19 DIAGNOSIS — J3089 Other allergic rhinitis: Secondary | ICD-10-CM | POA: Diagnosis not present

## 2016-10-19 DIAGNOSIS — J3081 Allergic rhinitis due to animal (cat) (dog) hair and dander: Secondary | ICD-10-CM | POA: Diagnosis not present

## 2016-10-22 DIAGNOSIS — F3341 Major depressive disorder, recurrent, in partial remission: Secondary | ICD-10-CM | POA: Diagnosis not present

## 2016-10-26 DIAGNOSIS — J3089 Other allergic rhinitis: Secondary | ICD-10-CM | POA: Diagnosis not present

## 2016-10-26 DIAGNOSIS — J301 Allergic rhinitis due to pollen: Secondary | ICD-10-CM | POA: Diagnosis not present

## 2016-10-26 DIAGNOSIS — J3081 Allergic rhinitis due to animal (cat) (dog) hair and dander: Secondary | ICD-10-CM | POA: Diagnosis not present

## 2016-11-05 ENCOUNTER — Ambulatory Visit
Admission: RE | Admit: 2016-11-05 | Discharge: 2016-11-05 | Disposition: A | Discharge status: Home / Self Care | Payer: PPO | Source: Ambulatory Visit | Attending: Urology | Admitting: Urology

## 2016-11-05 DIAGNOSIS — N200 Calculus of kidney: Secondary | ICD-10-CM

## 2016-11-05 DIAGNOSIS — N261 Atrophy of kidney (terminal): Secondary | ICD-10-CM | POA: Insufficient documentation

## 2016-11-05 DIAGNOSIS — N1330 Unspecified hydronephrosis: Secondary | ICD-10-CM | POA: Diagnosis not present

## 2016-11-05 DIAGNOSIS — Z87442 Personal history of urinary calculi: Secondary | ICD-10-CM | POA: Diagnosis not present

## 2016-11-09 DIAGNOSIS — J301 Allergic rhinitis due to pollen: Secondary | ICD-10-CM | POA: Diagnosis not present

## 2016-11-09 DIAGNOSIS — J3089 Other allergic rhinitis: Secondary | ICD-10-CM | POA: Diagnosis not present

## 2016-11-09 DIAGNOSIS — J3081 Allergic rhinitis due to animal (cat) (dog) hair and dander: Secondary | ICD-10-CM | POA: Diagnosis not present

## 2016-11-12 ENCOUNTER — Ambulatory Visit (INDEPENDENT_AMBULATORY_CARE_PROVIDER_SITE_OTHER): Payer: PPO | Admitting: Urology

## 2016-11-12 ENCOUNTER — Encounter: Payer: Self-pay | Admitting: Urology

## 2016-11-12 VITALS — BP 132/71 | HR 72 | Ht 60.0 in | Wt 183.0 lb

## 2016-11-12 DIAGNOSIS — Z87442 Personal history of urinary calculi: Secondary | ICD-10-CM | POA: Diagnosis not present

## 2016-11-12 DIAGNOSIS — N183 Chronic kidney disease, stage 3 unspecified: Secondary | ICD-10-CM

## 2016-11-12 DIAGNOSIS — Q6239 Other obstructive defects of renal pelvis and ureter: Secondary | ICD-10-CM

## 2016-11-12 DIAGNOSIS — Q6211 Congenital occlusion of ureteropelvic junction: Secondary | ICD-10-CM

## 2016-11-12 DIAGNOSIS — N3941 Urge incontinence: Secondary | ICD-10-CM

## 2016-11-12 NOTE — Progress Notes (Signed)
11/12/2016 10:41 AM   Brianna Cole 07-04-1941 [id number]  Referring provider: Lenard Simmer, MD [address] [place],  [address]  Chief Complaint  Patient presents with  . Results    4wk    HPI: 76 year old female Status post right ureteroscopy for treatment of a large 11 mm right upper pole stone as well as some nonobstructing smaller right lower pole stone. She tolerated surgery well and her stent was subsequently removed.  Intraoperatively, retrograde pyelogram with concerning for very mild UPJ obstruction with right renal pelvic fullness but no overt hydronephrosis and the scope was able to be advanced easily. She was otherwise asymptomatic from this. She does have baseline chronic kidney disease, stage III and creatinine was repeated after stent had been in place and was 1.8. This is actually slightly higher than baseline of 1.4.  Reivew of previous imaging dating back to 2009 shows renal pelvic fullness on the right.  Stone analysis shows calcium oxalate dihydrate 30%, calcium oxide monohydrate 50%, calcium phosphate 20%.  Follow-up renal ultrasound today shows chronic right renal pelvic fullness, no significant stone burden, otherwise unremarkable.  Since surgery, she's had some intermittent flank pain which is improving. She has no urinary symptoms other than her baseline urinary urgency. No dysuria.  She has stopped her Mybetriq 25 mg for unclear reasons which she takes for her baseline urinary frequency and incontinence around the time of surgery. She is anxious to resume this medication.  PMH: Past Medical History:  Diagnosis Date  . Anxiety   . Arthritis   . Asthma   . Cataract   . Chronic kidney disease   . Depression   . Diabetes mellitus without complication ([place])   . Environmental allergies    Followed by Dr. Donneta Romberg  . GERD (gastroesophageal reflux disease)   . History of IBS   . History of kidney stones   . Hyperlipidemia   . Hypertension    . Kidney stones   . Thyroid disease   . Urinary incontinence     Surgical History: Past Surgical History:  Procedure Laterality Date  . ABDOMINAL HYSTERECTOMY  1974  . CHOLECYSTECTOMY  1995  . CYSTOSCOPY W/ RETROGRADES Bilateral 09/21/2016   Procedure: CYSTOSCOPY WITH RETROGRADE PYELOGRAM;  Surgeon: Hollice Espy, MD;  Location: [facility];  Service: Urology;  Laterality: Bilateral;  . CYSTOSCOPY WITH STENT PLACEMENT Right 09/21/2016   Procedure: CYSTOSCOPY WITH STENT PLACEMENT;  Surgeon: Hollice Espy, MD;  Location: [facility];  Service: Urology;  Laterality: Right;  . [place]  . NASAL SINUS SURGERY    . URETEROSCOPY WITH HOLMIUM LASER LITHOTRIPSY Right 09/21/2016   Procedure: URETEROSCOPY WITH HOLMIUM LASER LITHOTRIPSY;  Surgeon: Hollice Espy, MD;  Location: [facility];  Service: Urology;  Laterality: Right;  Marland Kitchen VAGINAL DELIVERY     2, 1x forceps delivery    Home Medications:  Allergies as of 11/12/2016      Reactions   Shellfish Allergy Hives, Swelling   Wheat Bran Other (See Comments)   Iodinated Diagnostic Agents Rash      Medication List       Accurate as of 11/12/16 10:41 AM. Always use your most recent med list.          buPROPion 150 MG 12 hr tablet Commonly known as:  WELLBUTRIN SR Take 150 mg by mouth 2 (two) times daily.   diltiazem 180 MG 24 hr capsule Commonly known as:  CARDIZEM CD Take 1 capsule (180  mg total) by mouth daily.   estradiol 0.1 MG/GM vaginal cream Commonly known as:  ESTRACE VAGINAL Apply 0.5mg  (pea-sized amount)  just inside the vaginal introitus with a finger-tip every night for two weeks and then Monday, Wednesday and Friday nights.   fexofenadine 180 MG tablet Commonly known as:  ALLEGRA Take 180 mg by mouth daily as needed for allergies or rhinitis.   fluticasone 50 MCG/ACT nasal spray Commonly known as:  FLONASE Place 1-2 sprays into both nostrils daily as needed for allergies or rhinitis.     Fluticasone-Salmeterol 100-50 MCG/DOSE Aepb Commonly known as:  ADVAIR Inhale 1 puff into the lungs 2 (two) times daily as needed (for wheezing (respiratory issues)).   gabapentin 300 MG capsule Commonly known as:  NEURONTIN Take 1 capsule (300 mg total) by mouth 3 (three) times daily.   lansoprazole 15 MG capsule Commonly known as:  PREVACID Take 15 mg by mouth at bedtime.   metoprolol succinate 25 MG 24 hr tablet Commonly known as:  TOPROL-XL Take 25 mg by mouth daily.   ONE TOUCH ULTRA TEST test strip Generic drug:  glucose blood 1 each by Other route See admin instructions. Up to twice daily (scheduled checks every morning)   pravastatin 20 MG tablet Commonly known as:  PRAVACHOL Take 20 mg by mouth at bedtime.   PROAIR HFA 108 (90 Base) MCG/ACT inhaler Generic drug:  albuterol Inhale 2 puffs into the lungs every 6 (six) hours as needed for wheezing.   sertraline 100 MG tablet Commonly known as:  ZOLOFT Take 100 mg by mouth daily.   SYNTHROID 75 MCG tablet Generic drug:  levothyroxine 1 BY MOUTH EVERY MORNING , DO NOT SUBSTITUTE PLEASE.Marland Kitchen REPLACESDISCONTINUED LEVOXYL   SYSTANE OP Place 1-2 drops into both eyes 3 (three) times daily as needed (for dry eyes).   TRADJENTA 5 MG Tabs tablet Generic drug:  linagliptin Take 5 mg by mouth daily.   traZODone 50 MG tablet Commonly known as:  DESYREL Take 50 mg by mouth at bedtime.   Vitamin D (Ergocalciferol) 50000 units Caps capsule Commonly known as:  DRISDOL Take 50,000 Units by mouth every Saturday.   zoledronic acid 5 MG/100ML Soln injection Commonly known as:  RECLAST       Allergies:  Allergies  Allergen Reactions  . Shellfish Allergy Hives and Swelling  . Wheat Bran Other (See Comments)  . Iodinated Diagnostic Agents Rash    Family History: Family History  Problem Relation Age of Onset  . Arthritis Mother   . Hyperlipidemia Mother   . Hypertension Mother   . Kidney disease Mother   . Diabetes  Mother   . Cancer Mother     colon  . Cancer Father     lung  . Bladder Cancer Neg Hx   . Kidney cancer Neg Hx   . Prostate cancer Neg Hx     Social History:  reports that she has never smoked. She has never used smokeless tobacco. She reports that she does not drink alcohol or use drugs.  ROS: UROLOGY Frequent Urination?: No Hard to postpone urination?: No Burning/pain with urination?: No Get up at night to urinate?: No Leakage of urine?: No Urine stream starts and stops?: No Trouble starting stream?: No Do you have to strain to urinate?: No Blood in urine?: No Urinary tract infection?: No Sexually transmitted disease?: No Injury to kidneys or bladder?: No Painful intercourse?: No Weak stream?: No Currently pregnant?: No Vaginal bleeding?: No Last menstrual period?: n  Gastrointestinal Nausea?: No Vomiting?: No Indigestion/heartburn?: No Diarrhea?: No Constipation?: No  Constitutional Fever: No Night sweats?: No Weight loss?: No Fatigue?: No  Skin Skin rash/lesions?: No Itching?: No  Eyes Blurred vision?: No Double vision?: No  Ears/Nose/Throat Sore throat?: No Sinus problems?: No  Hematologic/Lymphatic Swollen glands?: No Easy bruising?: No  Cardiovascular Leg swelling?: No Chest pain?: No  Respiratory Cough?: No Shortness of breath?: No  Endocrine Excessive thirst?: No  Musculoskeletal Back pain?: No Joint pain?: No  Neurological Headaches?: No Dizziness?: No  Psychologic Depression?: No Anxiety?: No  Physical Exam: BP 132/71   Pulse 72   Ht 5' (1.524 m)   Wt 183 lb (83 kg)   BMI 35.74 kg/m   Constitutional:  Alert and oriented, No acute distress. HEENT: [place] AT, moist mucus membranes.  Trachea midline, no masses. Cardiovascular: No clubbing, cyanosis, or edema. Respiratory: Normal respiratory effort, no increased work of breathing. GI: Abdomen is soft, nontender, nondistended, no abdominal masses GU: No CVA tenderness.    Skin: No rashes, bruises or suspicious lesions. Neurologic: Grossly intact, no focal deficits, moving all 4 extremities. Psychiatric: Normal mood and affect.  Laboratory Data: Lab Results  Component Value Date   WBC 8.0 09/14/2016   HCT 37.3 09/14/2016   MCV 93 09/14/2016   PLT 218 09/14/2016    Lab Results  Component Value Date   CREATININE 1.81 (H) 10/05/2016     Lab Results  Component Value Date   HGBA1C 5.2 10/09/2012    Urinalysis n/a- no urinary symptoms today other than baseline  Pertinent Imaging: CLINICAL DATA:  Nephrolithiasis.  EXAM: RENAL / URINARY TRACT ULTRASOUND COMPLETE  COMPARISON:  CT scan of September 09, 2016.  FINDINGS: Right Kidney:  Length: 8.8 cm. Cortical scarring is noted. Mild hydronephrosis is noted. Echogenicity within normal limits. No mass visualized.  Left Kidney:  Length: 8.8 cm. Echogenicity within normal limits. No mass or hydronephrosis visualized.  Bladder:  Appears normal for degree of bladder distention.  IMPRESSION: Mild bilateral renal atrophy is noted. Right renal cortical scarring is noted. Mild right hydronephrosis is noted.   Electronically Signed   By: Marijo Conception, M.D.   On: 11/05/2016 15:56  Renal ultrasound personally reviewed today.  This was compared to her preoperative CT scan as well as CT scan from 2009.  Assessment & Plan:    1. History of kidney stones s/p successful right ureteroscopy for nonobstructing stones No residual stone burden on follow-up renal ultrasound Recommend KUB in 6 months to assess for metabolically active stone formation  Stone analysis reviewed with the patient We discussed general stone prevention techniques including drinking plenty water with goal of producing 2.5 L urine daily, increased citric acid intake, avoidance of high oxalate containing foods, and decreased salt intake.  Information about dietary recommendations given today.  - Abdomen 1 view (KUB);  Future  2. UPJ obstruction, congenital Review of previous renal imaging shows dilation of right renal pelvis, mild, chronic Cystometry today and renal ultrasound Retrograde consistent with probable mild UPJ obstruction Trial ureteral stent shows no improvement in her renal function Given her age and comorbidities, we'll continue to follow conservatively this point time She is agreeable with this plan  3. Stage 3 chronic kidney disease Cr 1.8 despite stent Atrophy noted bilaterally, likely related to multiple medical comorbidities  4. Urge incontinence of urine Resume Mybetriq   Return in about 6 months (around 05/15/2017) for KUB.  Hollice Espy, MD  [facility] [address],  [place], [place] [address] [phone]

## 2016-11-16 DIAGNOSIS — J3081 Allergic rhinitis due to animal (cat) (dog) hair and dander: Secondary | ICD-10-CM | POA: Diagnosis not present

## 2016-11-16 DIAGNOSIS — J3089 Other allergic rhinitis: Secondary | ICD-10-CM | POA: Diagnosis not present

## 2016-11-16 DIAGNOSIS — J301 Allergic rhinitis due to pollen: Secondary | ICD-10-CM | POA: Diagnosis not present

## 2016-11-23 ENCOUNTER — Other Ambulatory Visit: Payer: Self-pay | Admitting: Pharmacy Technician

## 2016-11-23 DIAGNOSIS — J3081 Allergic rhinitis due to animal (cat) (dog) hair and dander: Secondary | ICD-10-CM | POA: Diagnosis not present

## 2016-11-23 DIAGNOSIS — J3089 Other allergic rhinitis: Secondary | ICD-10-CM | POA: Diagnosis not present

## 2016-11-23 DIAGNOSIS — J301 Allergic rhinitis due to pollen: Secondary | ICD-10-CM | POA: Diagnosis not present

## 2016-11-23 NOTE — Patient Outreach (Signed)
[facility]) Care Management  11/23/2016  Brianna Cole 1941/03/09 [id number]   Contacted patient in reference to medication adherence for [employer]. Patient states she takes Tradjenta and Pravastatin daily as prescribed. She tries not to miss any doses but occasionally will. We discussed the cost of her medication Tradjenta and she informed me that it was a Tier 4 although it was listed as a Tier 3 drug on my spreadsheet. The patient expressed interest in 3 month supplies so I told her I would verify her co-pay and follow-up with her once I had verified the information. Once I spoke with [facility] and verified her cost I spoke with Lennette Bihari, Rph who verified that Januvia is the preferred medication. I contacted [name] again to inform her of the information that I found out and to inquire whether or not she would like the pharmacist to contact the prescriber to switch her to [place] for cost savings. The patient is in between doctor's due to her current physician not accepting HTA but I still contacted the office and requested 3 month's on her current medication's at the patient's request. I had to leave a message and I asked for the office to follow-up with me to verify whether or not he approved the request.  Doreene Burke, [place] [phone]

## 2016-11-30 DIAGNOSIS — J3081 Allergic rhinitis due to animal (cat) (dog) hair and dander: Secondary | ICD-10-CM | POA: Diagnosis not present

## 2016-11-30 DIAGNOSIS — J3089 Other allergic rhinitis: Secondary | ICD-10-CM | POA: Diagnosis not present

## 2016-11-30 DIAGNOSIS — J301 Allergic rhinitis due to pollen: Secondary | ICD-10-CM | POA: Diagnosis not present

## 2016-12-02 DIAGNOSIS — J3081 Allergic rhinitis due to animal (cat) (dog) hair and dander: Secondary | ICD-10-CM | POA: Diagnosis not present

## 2016-12-02 DIAGNOSIS — J3089 Other allergic rhinitis: Secondary | ICD-10-CM | POA: Diagnosis not present

## 2016-12-07 DIAGNOSIS — J301 Allergic rhinitis due to pollen: Secondary | ICD-10-CM | POA: Diagnosis not present

## 2016-12-07 DIAGNOSIS — J3081 Allergic rhinitis due to animal (cat) (dog) hair and dander: Secondary | ICD-10-CM | POA: Diagnosis not present

## 2016-12-07 DIAGNOSIS — J3089 Other allergic rhinitis: Secondary | ICD-10-CM | POA: Diagnosis not present

## 2016-12-14 DIAGNOSIS — J3089 Other allergic rhinitis: Secondary | ICD-10-CM | POA: Diagnosis not present

## 2016-12-14 DIAGNOSIS — J3081 Allergic rhinitis due to animal (cat) (dog) hair and dander: Secondary | ICD-10-CM | POA: Diagnosis not present

## 2016-12-14 DIAGNOSIS — J301 Allergic rhinitis due to pollen: Secondary | ICD-10-CM | POA: Diagnosis not present

## 2016-12-21 DIAGNOSIS — J3089 Other allergic rhinitis: Secondary | ICD-10-CM | POA: Diagnosis not present

## 2016-12-21 DIAGNOSIS — J301 Allergic rhinitis due to pollen: Secondary | ICD-10-CM | POA: Diagnosis not present

## 2016-12-21 DIAGNOSIS — J3081 Allergic rhinitis due to animal (cat) (dog) hair and dander: Secondary | ICD-10-CM | POA: Diagnosis not present

## 2016-12-28 DIAGNOSIS — J3081 Allergic rhinitis due to animal (cat) (dog) hair and dander: Secondary | ICD-10-CM | POA: Diagnosis not present

## 2016-12-28 DIAGNOSIS — J301 Allergic rhinitis due to pollen: Secondary | ICD-10-CM | POA: Diagnosis not present

## 2016-12-28 DIAGNOSIS — J3089 Other allergic rhinitis: Secondary | ICD-10-CM | POA: Diagnosis not present

## 2017-01-04 DIAGNOSIS — J301 Allergic rhinitis due to pollen: Secondary | ICD-10-CM | POA: Diagnosis not present

## 2017-01-04 DIAGNOSIS — J3081 Allergic rhinitis due to animal (cat) (dog) hair and dander: Secondary | ICD-10-CM | POA: Diagnosis not present

## 2017-01-04 DIAGNOSIS — J3089 Other allergic rhinitis: Secondary | ICD-10-CM | POA: Diagnosis not present

## 2017-01-11 DIAGNOSIS — J3089 Other allergic rhinitis: Secondary | ICD-10-CM | POA: Diagnosis not present

## 2017-01-11 DIAGNOSIS — J3081 Allergic rhinitis due to animal (cat) (dog) hair and dander: Secondary | ICD-10-CM | POA: Diagnosis not present

## 2017-01-11 DIAGNOSIS — J301 Allergic rhinitis due to pollen: Secondary | ICD-10-CM | POA: Diagnosis not present

## 2017-01-18 DIAGNOSIS — J301 Allergic rhinitis due to pollen: Secondary | ICD-10-CM | POA: Diagnosis not present

## 2017-01-18 DIAGNOSIS — J3089 Other allergic rhinitis: Secondary | ICD-10-CM | POA: Diagnosis not present

## 2017-01-18 DIAGNOSIS — J3081 Allergic rhinitis due to animal (cat) (dog) hair and dander: Secondary | ICD-10-CM | POA: Diagnosis not present

## 2017-01-25 DIAGNOSIS — J3089 Other allergic rhinitis: Secondary | ICD-10-CM | POA: Diagnosis not present

## 2017-01-25 DIAGNOSIS — J301 Allergic rhinitis due to pollen: Secondary | ICD-10-CM | POA: Diagnosis not present

## 2017-01-25 DIAGNOSIS — J3081 Allergic rhinitis due to animal (cat) (dog) hair and dander: Secondary | ICD-10-CM | POA: Diagnosis not present

## 2017-02-01 DIAGNOSIS — J3081 Allergic rhinitis due to animal (cat) (dog) hair and dander: Secondary | ICD-10-CM | POA: Diagnosis not present

## 2017-02-01 DIAGNOSIS — J3089 Other allergic rhinitis: Secondary | ICD-10-CM | POA: Diagnosis not present

## 2017-02-01 DIAGNOSIS — J301 Allergic rhinitis due to pollen: Secondary | ICD-10-CM | POA: Diagnosis not present

## 2017-02-03 DIAGNOSIS — J301 Allergic rhinitis due to pollen: Secondary | ICD-10-CM | POA: Diagnosis not present

## 2017-02-08 DIAGNOSIS — J3089 Other allergic rhinitis: Secondary | ICD-10-CM | POA: Diagnosis not present

## 2017-02-08 DIAGNOSIS — J301 Allergic rhinitis due to pollen: Secondary | ICD-10-CM | POA: Diagnosis not present

## 2017-02-08 DIAGNOSIS — J3081 Allergic rhinitis due to animal (cat) (dog) hair and dander: Secondary | ICD-10-CM | POA: Diagnosis not present

## 2017-02-10 DIAGNOSIS — K224 Dyskinesia of esophagus: Secondary | ICD-10-CM | POA: Diagnosis not present

## 2017-02-10 DIAGNOSIS — E7800 Pure hypercholesterolemia, unspecified: Secondary | ICD-10-CM | POA: Diagnosis not present

## 2017-02-10 DIAGNOSIS — E790 Hyperuricemia without signs of inflammatory arthritis and tophaceous disease: Secondary | ICD-10-CM | POA: Diagnosis not present

## 2017-02-10 DIAGNOSIS — E559 Vitamin D deficiency, unspecified: Secondary | ICD-10-CM | POA: Diagnosis not present

## 2017-02-10 DIAGNOSIS — I10 Essential (primary) hypertension: Secondary | ICD-10-CM | POA: Diagnosis not present

## 2017-02-10 DIAGNOSIS — N189 Chronic kidney disease, unspecified: Secondary | ICD-10-CM | POA: Diagnosis not present

## 2017-02-10 DIAGNOSIS — E039 Hypothyroidism, unspecified: Secondary | ICD-10-CM | POA: Diagnosis not present

## 2017-02-10 DIAGNOSIS — M810 Age-related osteoporosis without current pathological fracture: Secondary | ICD-10-CM | POA: Diagnosis not present

## 2017-02-10 DIAGNOSIS — G4701 Insomnia due to medical condition: Secondary | ICD-10-CM | POA: Diagnosis not present

## 2017-02-10 DIAGNOSIS — E782 Mixed hyperlipidemia: Secondary | ICD-10-CM | POA: Diagnosis not present

## 2017-02-10 DIAGNOSIS — E1165 Type 2 diabetes mellitus with hyperglycemia: Secondary | ICD-10-CM | POA: Diagnosis not present

## 2017-02-10 DIAGNOSIS — E669 Obesity, unspecified: Secondary | ICD-10-CM | POA: Diagnosis not present

## 2017-02-15 DIAGNOSIS — J301 Allergic rhinitis due to pollen: Secondary | ICD-10-CM | POA: Diagnosis not present

## 2017-02-15 DIAGNOSIS — J3089 Other allergic rhinitis: Secondary | ICD-10-CM | POA: Diagnosis not present

## 2017-02-15 DIAGNOSIS — J3081 Allergic rhinitis due to animal (cat) (dog) hair and dander: Secondary | ICD-10-CM | POA: Diagnosis not present

## 2017-02-17 DIAGNOSIS — Z1211 Encounter for screening for malignant neoplasm of colon: Secondary | ICD-10-CM | POA: Diagnosis not present

## 2017-02-17 DIAGNOSIS — Z1231 Encounter for screening mammogram for malignant neoplasm of breast: Secondary | ICD-10-CM | POA: Diagnosis not present

## 2017-02-17 DIAGNOSIS — Z23 Encounter for immunization: Secondary | ICD-10-CM | POA: Diagnosis not present

## 2017-02-17 DIAGNOSIS — Z0000 Encounter for general adult medical examination without abnormal findings: Secondary | ICD-10-CM | POA: Diagnosis not present

## 2017-02-22 DIAGNOSIS — J3089 Other allergic rhinitis: Secondary | ICD-10-CM | POA: Diagnosis not present

## 2017-02-22 DIAGNOSIS — J3081 Allergic rhinitis due to animal (cat) (dog) hair and dander: Secondary | ICD-10-CM | POA: Diagnosis not present

## 2017-02-22 DIAGNOSIS — J301 Allergic rhinitis due to pollen: Secondary | ICD-10-CM | POA: Diagnosis not present

## 2017-03-01 DIAGNOSIS — J3081 Allergic rhinitis due to animal (cat) (dog) hair and dander: Secondary | ICD-10-CM | POA: Diagnosis not present

## 2017-03-01 DIAGNOSIS — J3089 Other allergic rhinitis: Secondary | ICD-10-CM | POA: Diagnosis not present

## 2017-03-01 DIAGNOSIS — J301 Allergic rhinitis due to pollen: Secondary | ICD-10-CM | POA: Diagnosis not present

## 2017-03-03 ENCOUNTER — Other Ambulatory Visit: Payer: Self-pay

## 2017-03-03 DIAGNOSIS — N200 Calculus of kidney: Secondary | ICD-10-CM

## 2017-03-04 ENCOUNTER — Ambulatory Visit
Admission: RE | Admit: 2017-03-04 | Discharge: 2017-03-04 | Disposition: A | Discharge status: Home / Self Care | Payer: PPO | Source: Ambulatory Visit | Attending: Urology | Admitting: Urology

## 2017-03-04 ENCOUNTER — Other Ambulatory Visit: Payer: Self-pay

## 2017-03-04 ENCOUNTER — Other Ambulatory Visit
Admission: RE | Admit: 2017-03-04 | Discharge: 2017-03-04 | Disposition: A | Discharge status: Home / Self Care | Payer: PPO | Source: Ambulatory Visit | Attending: Urology | Admitting: Urology

## 2017-03-04 ENCOUNTER — Ambulatory Visit: Payer: PPO | Admitting: Urology

## 2017-03-04 DIAGNOSIS — N200 Calculus of kidney: Secondary | ICD-10-CM | POA: Diagnosis not present

## 2017-03-04 DIAGNOSIS — R109 Unspecified abdominal pain: Secondary | ICD-10-CM | POA: Diagnosis not present

## 2017-03-04 LAB — URINALYSIS, COMPLETE (UACMP) WITH MICROSCOPIC
BILIRUBIN URINE: NEGATIVE
Glucose, UA: NEGATIVE mg/dL
Hgb urine dipstick: NEGATIVE
Ketones, ur: NEGATIVE mg/dL
NITRITE: NEGATIVE
PH: 6 (ref 5.0–8.0)
Protein, ur: 30 mg/dL — AB
SPECIFIC GRAVITY, URINE: 1.025 (ref 1.005–1.030)

## 2017-03-06 LAB — URINE CULTURE: Culture: <10000 — AB

## 2017-03-08 DIAGNOSIS — J3081 Allergic rhinitis due to animal (cat) (dog) hair and dander: Secondary | ICD-10-CM | POA: Diagnosis not present

## 2017-03-08 DIAGNOSIS — J301 Allergic rhinitis due to pollen: Secondary | ICD-10-CM | POA: Diagnosis not present

## 2017-03-08 DIAGNOSIS — J3089 Other allergic rhinitis: Secondary | ICD-10-CM | POA: Diagnosis not present

## 2017-03-15 ENCOUNTER — Telehealth: Payer: Self-pay

## 2017-03-15 NOTE — Telephone Encounter (Signed)
-----   Message from Hollice Espy, MD sent at 03/08/2017 11:47 AM EDT ----- This patient made an appointment with me last Friday, when it and got her x-ray/ UA but never came to the actual appointment. I'm not sure what happened. Could you please follow up with her. We can reschedule for this Friday afternoon as it looks like she may have stone in her ureter.   Please review warning symptoms for urgent/ emergent intervention.   Hollice Espy, MD

## 2017-03-15 NOTE — Telephone Encounter (Signed)
LMOM

## 2017-03-16 NOTE — Telephone Encounter (Signed)
After asking to speak with [place] person hung up.

## 2017-03-17 DIAGNOSIS — J3081 Allergic rhinitis due to animal (cat) (dog) hair and dander: Secondary | ICD-10-CM | POA: Diagnosis not present

## 2017-03-17 DIAGNOSIS — J301 Allergic rhinitis due to pollen: Secondary | ICD-10-CM | POA: Diagnosis not present

## 2017-03-17 DIAGNOSIS — J3089 Other allergic rhinitis: Secondary | ICD-10-CM | POA: Diagnosis not present

## 2017-03-17 NOTE — Telephone Encounter (Signed)
LMOM- will send a letter.  

## 2017-03-22 ENCOUNTER — Encounter: Payer: Self-pay | Admitting: Urology

## 2017-03-22 ENCOUNTER — Ambulatory Visit (INDEPENDENT_AMBULATORY_CARE_PROVIDER_SITE_OTHER): Payer: PPO | Admitting: Urology

## 2017-03-22 ENCOUNTER — Ambulatory Visit
Admission: RE | Admit: 2017-03-22 | Discharge: 2017-03-22 | Disposition: A | Discharge status: Home / Self Care | Payer: PPO | Source: Ambulatory Visit | Attending: Urology | Admitting: Urology

## 2017-03-22 VITALS — BP 179/84 | HR 68 | Ht 60.0 in | Wt 183.0 lb

## 2017-03-22 DIAGNOSIS — N3941 Urge incontinence: Secondary | ICD-10-CM | POA: Diagnosis not present

## 2017-03-22 DIAGNOSIS — R109 Unspecified abdominal pain: Secondary | ICD-10-CM | POA: Diagnosis not present

## 2017-03-22 DIAGNOSIS — Z87442 Personal history of urinary calculi: Secondary | ICD-10-CM | POA: Insufficient documentation

## 2017-03-22 DIAGNOSIS — N200 Calculus of kidney: Secondary | ICD-10-CM | POA: Diagnosis not present

## 2017-03-22 NOTE — Progress Notes (Signed)
03/22/2017 2:53 PM   Brianna Cole [birth year] [id number]  Referring provider: Lenard Simmer, MD [address] [place], [place] [address]  Chief Complaint  Patient presents with  . Follow-up    HPI: 76 yo F with history of recurrent nephrolithiasis who called our office to schedule an appointment in the setting of right flank pain.  She was scheduled to see me on 03/04/2017, which got her UA and KUB, but never came to the actual appointment. KUB at that time showed an apparent new right pelvic calcification suspicious for right distal ureteral calculus.  Her UA did have 6-30 red blood cells and white blood cells as well as bacteria but urine culture was ultimately negative.  She returned today for follow-up appointment. She reports that she had nausea since, Sutures for the appointment previously.  Since the 24th, she's had no further right flank pain. She denies any significant urinary symptoms today different from her baseline . No gross hematuria.  KUB today does show persistent ? stone in the right lower quadrant.    Recently underwent right ureteroscopy for right upper pole stone on 09/2016.  Of note, she does have chronic right renal pelvic fullness dating back to at least 2009.  PMH: Past Medical History:  Diagnosis Date  . Anxiety   . Arthritis   . Asthma   . Cataract   . Chronic kidney disease   . Depression   . Diabetes mellitus without complication ([place])   . Environmental allergies    Followed by Dr. Donneta Romberg  . GERD (gastroesophageal reflux disease)   . History of IBS   . History of kidney stones   . Hyperlipidemia   . Hypertension   . Kidney stones   . Thyroid disease   . Urinary incontinence     Surgical History: Past Surgical History:  Procedure Laterality Date  . ABDOMINAL HYSTERECTOMY  1974  . CHOLECYSTECTOMY  1995  . CYSTOSCOPY W/ RETROGRADES Bilateral 09/21/2016   Procedure: CYSTOSCOPY WITH RETROGRADE PYELOGRAM;  Surgeon: Hollice Espy, MD;   Location: [facility];  Service: Urology;  Laterality: Bilateral;  . CYSTOSCOPY WITH STENT PLACEMENT Right 09/21/2016   Procedure: CYSTOSCOPY WITH STENT PLACEMENT;  Surgeon: Hollice Espy, MD;  Location: [facility];  Service: Urology;  Laterality: Right;  . [place]  . NASAL SINUS SURGERY    . URETEROSCOPY WITH HOLMIUM LASER LITHOTRIPSY Right 09/21/2016   Procedure: URETEROSCOPY WITH HOLMIUM LASER LITHOTRIPSY;  Surgeon: Hollice Espy, MD;  Location: [facility];  Service: Urology;  Laterality: Right;  Marland Kitchen VAGINAL DELIVERY     2, 1x forceps delivery    Home Medications:  Allergies as of 03/22/2017      Reactions   Shellfish Allergy Hives, Swelling   Iodinated Diagnostic Agents Rash   Wheat Bran Other (See Comments)      Medication List       Accurate as of 03/22/17 11:59 PM. Always use your most recent med list.          buPROPion 150 MG 12 hr tablet Commonly known as:  WELLBUTRIN SR Take 150 mg by mouth 2 (two) times daily.   diltiazem 180 MG 24 hr capsule Commonly known as:  CARDIZEM CD Take 1 capsule (180 mg total) by mouth daily.   estradiol 0.1 MG/GM vaginal cream Commonly known as:  ESTRACE VAGINAL Apply 0.5mg  (pea-sized amount)  just inside the vaginal introitus with a finger-tip every night for two weeks and then Monday,  Wednesday and Friday nights.   fexofenadine 180 MG tablet Commonly known as:  ALLEGRA Take 180 mg by mouth daily as needed for allergies or rhinitis.   fluticasone 50 MCG/ACT nasal spray Commonly known as:  FLONASE Place 1-2 sprays into both nostrils daily as needed for allergies or rhinitis.   Fluticasone-Salmeterol 100-50 MCG/DOSE Aepb Commonly known as:  ADVAIR Inhale 1 puff into the lungs 2 (two) times daily as needed (for wheezing (respiratory issues)).   lansoprazole 15 MG capsule Commonly known as:  PREVACID Take 15 mg by mouth at bedtime.   metoprolol succinate 25 MG 24 hr tablet Commonly known as:  TOPROL-XL Take 25  mg by mouth daily.   ONE TOUCH ULTRA TEST test strip Generic drug:  glucose blood 1 each by Other route See admin instructions. Up to twice daily (scheduled checks every morning)   pravastatin 20 MG tablet Commonly known as:  PRAVACHOL Take 20 mg by mouth at bedtime.   PROAIR HFA 108 (90 Base) MCG/ACT inhaler Generic drug:  albuterol Inhale 2 puffs into the lungs every 6 (six) hours as needed for wheezing.   sertraline 100 MG tablet Commonly known as:  ZOLOFT Take 100 mg by mouth daily.   SYNTHROID 75 MCG tablet Generic drug:  levothyroxine 1 BY MOUTH EVERY MORNING , DO NOT SUBSTITUTE PLEASE.Marland Kitchen REPLACESDISCONTINUED LEVOXYL   SYSTANE OP Place 1-2 drops into both eyes 3 (three) times daily as needed (for dry eyes).   TRADJENTA 5 MG Tabs tablet Generic drug:  linagliptin Take 5 mg by mouth daily.   traZODone 50 MG tablet Commonly known as:  DESYREL Take 50 mg by mouth at bedtime.   Vitamin D (Ergocalciferol) 50000 units Caps capsule Commonly known as:  DRISDOL Take 50,000 Units by mouth every Saturday.   zoledronic acid 5 MG/100ML Soln injection Commonly known as:  RECLAST 5 mg once.            Discharge Care Instructions        Start     Ordered   03/25/17 0000  US RENAL    Question Answer Comment  Reason for Exam (SYMPTOM  OR DIAGNOSIS REQUIRED) right flank pain, pelvic calcification on KUB   Preferred imaging location? [facility]      03/25/17 1448      Allergies:  Allergies  Allergen Reactions  . Shellfish Allergy Hives and Swelling  . Iodinated Diagnostic Agents Rash  . Wheat Bran Other (See Comments)    Family History: Family History  Problem Relation Age of Onset  . Arthritis Mother   . Hyperlipidemia Mother   . Hypertension Mother   . Kidney disease Mother   . Diabetes Mother   . Cancer Mother        colon  . Cancer Father        lung  . Bladder Cancer Neg Hx   . Kidney cancer Neg Hx   . Prostate cancer Neg Hx      Social History:  reports that she has never smoked. She has never used smokeless tobacco. She reports that she does not drink alcohol or use drugs.  ROS: UROLOGY Frequent Urination?: No Hard to postpone urination?: No Burning/pain with urination?: No Get up at night to urinate?: No Leakage of urine?: No Urine stream starts and stops?: No Trouble starting stream?: No Do you have to strain to urinate?: No Blood in urine?: No Urinary tract infection?: No Sexually transmitted disease?: No Injury to kidneys or bladder?: No Painful  intercourse?: No Weak stream?: No Currently pregnant?: No Vaginal bleeding?: No Last menstrual period?: n  Gastrointestinal Nausea?: No Vomiting?: No Indigestion/heartburn?: No Diarrhea?: No Constipation?: No  Constitutional Fever: No Night sweats?: No Weight loss?: No Fatigue?: No  Skin Skin rash/lesions?: No Itching?: No  Eyes Blurred vision?: No Double vision?: No  Ears/Nose/Throat Sore throat?: No Sinus problems?: Yes  Hematologic/Lymphatic Swollen glands?: No Easy bruising?: No  Cardiovascular Leg swelling?: No Chest pain?: No  Respiratory Cough?: Yes Shortness of breath?: No  Endocrine Excessive thirst?: No  Musculoskeletal Back pain?: No Joint pain?: No  Neurological Headaches?: No     Physical Exam: BP (!) 179/84   Pulse 68   Ht 5' (1.524 m)   Wt 183 lb (83 kg)   BMI 35.74 kg/m   Constitutional:  Alert and oriented, No acute distress. HEENT: [place] AT, moist mucus membranes.  Trachea midline, no masses. Cardiovascular: No clubbing, cyanosis, or edema. Respiratory: Normal respiratory effort, no increased work of breathing. GI: Abdomen is soft, nontender, nondistended, no abdominal masses GU: No CVA tenderness.  Skin: No rashes, bruises or suspicious lesions. Neurologic: Grossly intact, no focal deficits, moving all 4 extremities. Psychiatric: Normal mood and affect.  Laboratory Data: Lab Results   Component Value Date   WBC 8.0 09/14/2016   HGB 12.4 09/14/2016   HCT 37.3 09/14/2016   MCV 93 09/14/2016   PLT 218 09/14/2016    Lab Results  Component Value Date   CREATININE 1.81 (H) 10/05/2016    Lab Results  Component Value Date   HGBA1C 5.2 10/09/2012    Urinalysis Results for orders placed or performed during the hospital encounter of 03/04/17  Urine culture  Result Value Ref Range   Specimen Description URINE, CLEAN CATCH    Special Requests NONE    Culture (A)     <10,000 COLONIES/mL Performed at [facility], [place] [address], [place], [place] [address]    Report Status 03/06/2017 FINAL   Urinalysis, Complete w Microscopic  Result Value Ref Range   Color, Urine YELLOW YELLOW   APPearance HAZY (A) CLEAR   Specific Gravity, Urine 1.025 1.005 - 1.030   pH 6.0 5.0 - 8.0   Glucose, UA NEGATIVE NEGATIVE mg/dL   Hgb urine dipstick NEGATIVE NEGATIVE   Bilirubin Urine NEGATIVE NEGATIVE   Ketones, ur NEGATIVE NEGATIVE mg/dL   Protein, ur 30 (A) NEGATIVE mg/dL   Nitrite NEGATIVE NEGATIVE   Leukocytes, UA SMALL (A) NEGATIVE   Squamous Epithelial / LPF 0-5 (A) NONE SEEN   WBC, UA 6-30 0 - 5 WBC/hpf   RBC / HPF 6-30 0 - 5 RBC/hpf   Bacteria, UA MANY (A) NONE SEEN   Ca Oxalate Crys, UA PRESENT     Pertinent Imaging: CLINICAL DATA:  Right kidney stones.  EXAM: ABDOMEN - 1 VIEW  COMPARISON:  Radiographs of March 04, 2017.  FINDINGS: The bowel gas pattern is normal. Status post cholecystectomy. Rounded calcification seen in right pelvis is unchanged compared to prior exam concerning for ureteral calculus.  IMPRESSION: Stable rounded calcification seen in right pelvis which may represent distal ureteral calculus. CT urogram may be performed further evaluation. No evidence of bowel obstruction or ileus.   Electronically Signed   By: Marijo Conception, M.D.   On: 03/23/2017 08:45  KUB personally reviewed today  Assessment & Plan:    1. Right  flank pain No further flank pain since 03/04/2017 but with a new pelvic calcification persisted and KUB, concerning  for possible retained ureteral stone Warning symptoms reviewed Discussed further imaging the form of a noncontrast CT scan versus renal ultrasound- (anticipate baseline renal pelvic fullness but will assess for further/worsening of hydronephrosis) If hydronephrosis is worse, will recommend further surgical intervention for stone - US RENAL; Future  2. History of nephrolithiasis As above  3. Urge incontinence of urine Continue mybetriq   Will call with RUS results  Hollice Espy, MD  [facility] [address], [place], [place] [address] [phone]

## 2017-03-24 DIAGNOSIS — J3089 Other allergic rhinitis: Secondary | ICD-10-CM | POA: Diagnosis not present

## 2017-03-24 DIAGNOSIS — J301 Allergic rhinitis due to pollen: Secondary | ICD-10-CM | POA: Diagnosis not present

## 2017-03-24 DIAGNOSIS — J3081 Allergic rhinitis due to animal (cat) (dog) hair and dander: Secondary | ICD-10-CM | POA: Diagnosis not present

## 2017-03-29 DIAGNOSIS — J3089 Other allergic rhinitis: Secondary | ICD-10-CM | POA: Diagnosis not present

## 2017-03-29 DIAGNOSIS — J3081 Allergic rhinitis due to animal (cat) (dog) hair and dander: Secondary | ICD-10-CM | POA: Diagnosis not present

## 2017-03-29 DIAGNOSIS — J301 Allergic rhinitis due to pollen: Secondary | ICD-10-CM | POA: Diagnosis not present

## 2017-04-07 DIAGNOSIS — J3089 Other allergic rhinitis: Secondary | ICD-10-CM | POA: Diagnosis not present

## 2017-04-07 DIAGNOSIS — J3081 Allergic rhinitis due to animal (cat) (dog) hair and dander: Secondary | ICD-10-CM | POA: Diagnosis not present

## 2017-04-07 DIAGNOSIS — J301 Allergic rhinitis due to pollen: Secondary | ICD-10-CM | POA: Diagnosis not present

## 2017-04-11 ENCOUNTER — Telehealth: Payer: Self-pay | Admitting: Urology

## 2017-04-11 NOTE — Telephone Encounter (Signed)
This patient was supposed to get a renal ultrasound which was ordered but appears never to have been scheduled. Can you please follow up and see why it never got schedule. We'll call her with results as discussed.  Hollice Espy, MD

## 2017-04-11 NOTE — Telephone Encounter (Signed)
Scheduling has reached out to this patient several times and they said that they had to check their schedules and call back to schedule this and never did. I will reach out to him and see what's going on.  Sharyn Lull

## 2017-04-11 NOTE — Telephone Encounter (Signed)
Patient is scheduling it right now. I transferred her over and spoke with them to make sure they knew what she needed.  Brianna Cole

## 2017-04-12 DIAGNOSIS — J3089 Other allergic rhinitis: Secondary | ICD-10-CM | POA: Diagnosis not present

## 2017-04-12 DIAGNOSIS — J3081 Allergic rhinitis due to animal (cat) (dog) hair and dander: Secondary | ICD-10-CM | POA: Diagnosis not present

## 2017-04-12 DIAGNOSIS — J301 Allergic rhinitis due to pollen: Secondary | ICD-10-CM | POA: Diagnosis not present

## 2017-04-14 ENCOUNTER — Ambulatory Visit: Payer: PPO

## 2017-04-18 DIAGNOSIS — F3341 Major depressive disorder, recurrent, in partial remission: Secondary | ICD-10-CM | POA: Diagnosis not present

## 2017-04-19 DIAGNOSIS — J3081 Allergic rhinitis due to animal (cat) (dog) hair and dander: Secondary | ICD-10-CM | POA: Diagnosis not present

## 2017-04-19 DIAGNOSIS — J3089 Other allergic rhinitis: Secondary | ICD-10-CM | POA: Diagnosis not present

## 2017-04-19 DIAGNOSIS — J301 Allergic rhinitis due to pollen: Secondary | ICD-10-CM | POA: Diagnosis not present

## 2017-04-26 DIAGNOSIS — J3089 Other allergic rhinitis: Secondary | ICD-10-CM | POA: Diagnosis not present

## 2017-04-26 DIAGNOSIS — J301 Allergic rhinitis due to pollen: Secondary | ICD-10-CM | POA: Diagnosis not present

## 2017-04-26 DIAGNOSIS — J3081 Allergic rhinitis due to animal (cat) (dog) hair and dander: Secondary | ICD-10-CM | POA: Diagnosis not present

## 2017-04-28 DIAGNOSIS — J3089 Other allergic rhinitis: Secondary | ICD-10-CM | POA: Diagnosis not present

## 2017-04-28 DIAGNOSIS — J3081 Allergic rhinitis due to animal (cat) (dog) hair and dander: Secondary | ICD-10-CM | POA: Diagnosis not present

## 2017-05-03 DIAGNOSIS — J301 Allergic rhinitis due to pollen: Secondary | ICD-10-CM | POA: Diagnosis not present

## 2017-05-03 DIAGNOSIS — J3081 Allergic rhinitis due to animal (cat) (dog) hair and dander: Secondary | ICD-10-CM | POA: Diagnosis not present

## 2017-05-03 DIAGNOSIS — J3089 Other allergic rhinitis: Secondary | ICD-10-CM | POA: Diagnosis not present

## 2017-05-04 ENCOUNTER — Telehealth: Payer: Self-pay | Admitting: Urology

## 2017-05-04 NOTE — Telephone Encounter (Signed)
Discussed with patient.  She has agreed to have this done.  She reports that she did not have the street of the ultrasound placed so could not go to the appointment.    Please help her set this up again.  She understands the importance.

## 2017-05-04 NOTE — Telephone Encounter (Signed)
She did schedule it for 04-14-17 when I transferred her but she no showed for it. Maybe you need to call her?? I have tried and she will not go.  Sharyn Lull

## 2017-05-04 NOTE — Telephone Encounter (Signed)
This patient has still not had her renal ultrasound.  Last phone note indicates that she was transferred to scheduling to schedule this but I do not see that it has been done.  Please follow-up ensure that this is scheduled.  Hollice Espy, MD

## 2017-05-05 ENCOUNTER — Telehealth: Payer: Self-pay | Admitting: Urology

## 2017-05-05 NOTE — Telephone Encounter (Signed)
They will call her again   [place]

## 2017-05-09 DIAGNOSIS — R0989 Other specified symptoms and signs involving the circulatory and respiratory systems: Secondary | ICD-10-CM | POA: Diagnosis not present

## 2017-05-10 DIAGNOSIS — E7800 Pure hypercholesterolemia, unspecified: Secondary | ICD-10-CM | POA: Diagnosis not present

## 2017-05-10 DIAGNOSIS — E039 Hypothyroidism, unspecified: Secondary | ICD-10-CM | POA: Diagnosis not present

## 2017-05-10 DIAGNOSIS — J3089 Other allergic rhinitis: Secondary | ICD-10-CM | POA: Diagnosis not present

## 2017-05-10 DIAGNOSIS — E782 Mixed hyperlipidemia: Secondary | ICD-10-CM | POA: Diagnosis not present

## 2017-05-10 DIAGNOSIS — J301 Allergic rhinitis due to pollen: Secondary | ICD-10-CM | POA: Diagnosis not present

## 2017-05-10 DIAGNOSIS — J3081 Allergic rhinitis due to animal (cat) (dog) hair and dander: Secondary | ICD-10-CM | POA: Diagnosis not present

## 2017-05-10 DIAGNOSIS — E1165 Type 2 diabetes mellitus with hyperglycemia: Secondary | ICD-10-CM | POA: Diagnosis not present

## 2017-05-11 ENCOUNTER — Ambulatory Visit
Admission: RE | Admit: 2017-05-11 | Discharge: 2017-05-11 | Disposition: A | Discharge status: Home / Self Care | Payer: PPO | Source: Ambulatory Visit | Attending: Urology | Admitting: Urology

## 2017-05-11 DIAGNOSIS — R109 Unspecified abdominal pain: Secondary | ICD-10-CM | POA: Diagnosis not present

## 2017-05-11 DIAGNOSIS — R935 Abnormal findings on diagnostic imaging of other abdominal regions, including retroperitoneum: Secondary | ICD-10-CM | POA: Insufficient documentation

## 2017-05-16 DIAGNOSIS — G4701 Insomnia due to medical condition: Secondary | ICD-10-CM | POA: Diagnosis not present

## 2017-05-16 DIAGNOSIS — E039 Hypothyroidism, unspecified: Secondary | ICD-10-CM | POA: Diagnosis not present

## 2017-05-16 DIAGNOSIS — N390 Urinary tract infection, site not specified: Secondary | ICD-10-CM | POA: Diagnosis not present

## 2017-05-16 DIAGNOSIS — E7800 Pure hypercholesterolemia, unspecified: Secondary | ICD-10-CM | POA: Diagnosis not present

## 2017-05-16 DIAGNOSIS — N189 Chronic kidney disease, unspecified: Secondary | ICD-10-CM | POA: Diagnosis not present

## 2017-05-16 DIAGNOSIS — E119 Type 2 diabetes mellitus without complications: Secondary | ICD-10-CM | POA: Diagnosis not present

## 2017-05-16 DIAGNOSIS — I10 Essential (primary) hypertension: Secondary | ICD-10-CM | POA: Diagnosis not present

## 2017-05-16 DIAGNOSIS — E782 Mixed hyperlipidemia: Secondary | ICD-10-CM | POA: Diagnosis not present

## 2017-05-16 DIAGNOSIS — E790 Hyperuricemia without signs of inflammatory arthritis and tophaceous disease: Secondary | ICD-10-CM | POA: Diagnosis not present

## 2017-05-16 DIAGNOSIS — E1165 Type 2 diabetes mellitus with hyperglycemia: Secondary | ICD-10-CM | POA: Diagnosis not present

## 2017-05-19 DIAGNOSIS — J301 Allergic rhinitis due to pollen: Secondary | ICD-10-CM | POA: Diagnosis not present

## 2017-05-19 DIAGNOSIS — J3089 Other allergic rhinitis: Secondary | ICD-10-CM | POA: Diagnosis not present

## 2017-05-19 DIAGNOSIS — J3081 Allergic rhinitis due to animal (cat) (dog) hair and dander: Secondary | ICD-10-CM | POA: Diagnosis not present

## 2017-05-20 ENCOUNTER — Telehealth: Payer: Self-pay

## 2017-05-20 ENCOUNTER — Ambulatory Visit: Payer: PPO | Admitting: Urology

## 2017-05-20 NOTE — Telephone Encounter (Signed)
Not able to get touch with pt. Will send a letter.

## 2017-05-20 NOTE — Telephone Encounter (Signed)
-----   Message from Hollice Espy, MD sent at 05/16/2017  4:01 PM EST ----- Renal ultrasound is reassuring.  If you began having flank pain again, please call us ASAP and we  will order a CT urogram, otherwise, I think that the stone has passed given the lack of hydronephrosis (swelling).  See you in March as scheduled.    Hollice Espy, MD

## 2017-05-26 DIAGNOSIS — J3089 Other allergic rhinitis: Secondary | ICD-10-CM | POA: Diagnosis not present

## 2017-05-26 DIAGNOSIS — J3081 Allergic rhinitis due to animal (cat) (dog) hair and dander: Secondary | ICD-10-CM | POA: Diagnosis not present

## 2017-05-26 DIAGNOSIS — J301 Allergic rhinitis due to pollen: Secondary | ICD-10-CM | POA: Diagnosis not present

## 2017-05-31 DIAGNOSIS — J301 Allergic rhinitis due to pollen: Secondary | ICD-10-CM | POA: Diagnosis not present

## 2017-05-31 DIAGNOSIS — J3081 Allergic rhinitis due to animal (cat) (dog) hair and dander: Secondary | ICD-10-CM | POA: Diagnosis not present

## 2017-05-31 DIAGNOSIS — J3089 Other allergic rhinitis: Secondary | ICD-10-CM | POA: Diagnosis not present

## 2017-06-07 DIAGNOSIS — J301 Allergic rhinitis due to pollen: Secondary | ICD-10-CM | POA: Diagnosis not present

## 2017-06-07 DIAGNOSIS — J3081 Allergic rhinitis due to animal (cat) (dog) hair and dander: Secondary | ICD-10-CM | POA: Diagnosis not present

## 2017-06-07 DIAGNOSIS — J3089 Other allergic rhinitis: Secondary | ICD-10-CM | POA: Diagnosis not present

## 2017-06-14 DIAGNOSIS — J301 Allergic rhinitis due to pollen: Secondary | ICD-10-CM | POA: Diagnosis not present

## 2017-06-14 DIAGNOSIS — J3081 Allergic rhinitis due to animal (cat) (dog) hair and dander: Secondary | ICD-10-CM | POA: Diagnosis not present

## 2017-06-14 DIAGNOSIS — J3089 Other allergic rhinitis: Secondary | ICD-10-CM | POA: Diagnosis not present

## 2017-06-16 DIAGNOSIS — J301 Allergic rhinitis due to pollen: Secondary | ICD-10-CM | POA: Diagnosis not present

## 2017-06-16 DIAGNOSIS — J3089 Other allergic rhinitis: Secondary | ICD-10-CM | POA: Diagnosis not present

## 2017-06-16 DIAGNOSIS — J3081 Allergic rhinitis due to animal (cat) (dog) hair and dander: Secondary | ICD-10-CM | POA: Diagnosis not present

## 2017-06-21 DIAGNOSIS — J3081 Allergic rhinitis due to animal (cat) (dog) hair and dander: Secondary | ICD-10-CM | POA: Diagnosis not present

## 2017-06-21 DIAGNOSIS — J301 Allergic rhinitis due to pollen: Secondary | ICD-10-CM | POA: Diagnosis not present

## 2017-06-21 DIAGNOSIS — J3089 Other allergic rhinitis: Secondary | ICD-10-CM | POA: Diagnosis not present

## 2017-06-28 DIAGNOSIS — J3089 Other allergic rhinitis: Secondary | ICD-10-CM | POA: Diagnosis not present

## 2017-06-28 DIAGNOSIS — J3081 Allergic rhinitis due to animal (cat) (dog) hair and dander: Secondary | ICD-10-CM | POA: Diagnosis not present

## 2017-06-28 DIAGNOSIS — J301 Allergic rhinitis due to pollen: Secondary | ICD-10-CM | POA: Diagnosis not present

## 2017-07-07 DIAGNOSIS — J3081 Allergic rhinitis due to animal (cat) (dog) hair and dander: Secondary | ICD-10-CM | POA: Diagnosis not present

## 2017-07-07 DIAGNOSIS — J301 Allergic rhinitis due to pollen: Secondary | ICD-10-CM | POA: Diagnosis not present

## 2017-07-07 DIAGNOSIS — J3089 Other allergic rhinitis: Secondary | ICD-10-CM | POA: Diagnosis not present

## 2017-09-20 ENCOUNTER — Encounter: Payer: Self-pay | Admitting: Urology

## 2017-09-20 ENCOUNTER — Ambulatory Visit
Admission: RE | Admit: 2017-09-20 | Discharge: 2017-09-20 | Disposition: A | Discharge status: Home / Self Care | Payer: Medicare HMO | Source: Ambulatory Visit | Attending: Urology | Admitting: Urology

## 2017-09-20 ENCOUNTER — Other Ambulatory Visit: Payer: Self-pay | Admitting: Urology

## 2017-09-20 ENCOUNTER — Ambulatory Visit: Payer: Medicare HMO | Admitting: Urology

## 2017-09-20 VITALS — BP 134/80 | HR 65 | Ht 62.0 in | Wt 167.0 lb

## 2017-09-20 DIAGNOSIS — N390 Urinary tract infection, site not specified: Secondary | ICD-10-CM

## 2017-09-20 DIAGNOSIS — R8281 Pyuria: Secondary | ICD-10-CM

## 2017-09-20 DIAGNOSIS — N200 Calculus of kidney: Secondary | ICD-10-CM | POA: Insufficient documentation

## 2017-09-20 DIAGNOSIS — Z87442 Personal history of urinary calculi: Secondary | ICD-10-CM

## 2017-09-20 LAB — MICROSCOPIC EXAMINATION: WBC, UA: >30 /hpf — ABNORMAL HIGH (ref 0–?)

## 2017-09-20 LAB — URINALYSIS, COMPLETE
Bilirubin, UA: NEGATIVE
Glucose, UA: NEGATIVE
NITRITE UA: NEGATIVE
SPEC GRAV UA: 1.025 (ref 1.005–1.030)
Urobilinogen, Ur: 0.2 mg/dL (ref 0.2–1.0)
pH, UA: 6 (ref 5.0–7.5)

## 2017-09-20 NOTE — Progress Notes (Signed)
09/20/2017 7:45 PM   Brianna Cole 1941-04-29 [id number]  Referring provider: Lenard Simmer, MD [address] [place], [place] [address]  Chief Complaint  Patient presents with  . Nephrolithiasis    65month w/KUB    HPI: 77 year old female with a history of recurrent nephrolithiasis who returns today for routine follow-up.  This past fall, she was struggling with right flank pain.  This is associated with hematuria.  She underwent KUB which showed suggestion of possible right distal ureteral stone.  Follow-up renal ultrasound showed no evidence of significant hydronephrosis, chronic renal pelvic fullness.  Her pain completely resolved.  Today, she has no flank pain, hematuria, or any other urinary complaints.  She is overall feeling quite well.  Right ureteroscopy for right upper pole stone on 09/2016.  She does also have a chronic right renal pelvic fullness dating back to at least 2009.   PMH: Past Medical History:  Diagnosis Date  . Anxiety   . Arthritis   . Asthma   . Cataract   . Chronic kidney disease   . Depression   . Diabetes mellitus without complication ([place])   . Environmental allergies    Followed by Dr. Donneta Romberg  . GERD (gastroesophageal reflux disease)   . History of IBS   . History of kidney stones   . Hyperlipidemia   . Hypertension   . Kidney stones   . Thyroid disease   . Urinary incontinence     Surgical History: Past Surgical History:  Procedure Laterality Date  . ABDOMINAL HYSTERECTOMY  1974  . CHOLECYSTECTOMY  1995  . CYSTOSCOPY W/ RETROGRADES Bilateral 09/21/2016   Procedure: CYSTOSCOPY WITH RETROGRADE PYELOGRAM;  Surgeon: Hollice Espy, MD;  Location: [facility];  Service: Urology;  Laterality: Bilateral;  . CYSTOSCOPY WITH STENT PLACEMENT Right 09/21/2016   Procedure: CYSTOSCOPY WITH STENT PLACEMENT;  Surgeon: Hollice Espy, MD;  Location: [facility];  Service: Urology;  Laterality: Right;  . [place]  . NASAL  SINUS SURGERY    . URETEROSCOPY WITH HOLMIUM LASER LITHOTRIPSY Right 09/21/2016   Procedure: URETEROSCOPY WITH HOLMIUM LASER LITHOTRIPSY;  Surgeon: Hollice Espy, MD;  Location: [facility];  Service: Urology;  Laterality: Right;  Marland Kitchen VAGINAL DELIVERY     2, 1x forceps delivery    Home Medications:  Allergies as of 09/20/2017      Reactions   Shellfish Allergy Hives, Swelling   Iodinated Diagnostic Agents Rash   Wheat Bran Other (See Comments)      Medication List        Accurate as of 09/20/17  7:45 PM. Always use your most recent med list.          buPROPion 150 MG 12 hr tablet Commonly known as:  WELLBUTRIN SR Take 150 mg by mouth 2 (two) times daily.   diltiazem 180 MG 24 hr capsule Commonly known as:  CARDIZEM CD Take 1 capsule (180 mg total) by mouth daily.   estradiol 0.1 MG/GM vaginal cream Commonly known as:  ESTRACE VAGINAL Apply 0.5mg  (pea-sized amount)  just inside the vaginal introitus with a finger-tip every night for two weeks and then Monday, Wednesday and Friday nights.   fexofenadine 180 MG tablet Commonly known as:  ALLEGRA Take 180 mg by mouth daily as needed for allergies or rhinitis.   fluticasone 50 MCG/ACT nasal spray Commonly known as:  FLONASE Place 1-2 sprays into both nostrils daily as needed for allergies or rhinitis.   Fluticasone-Salmeterol 100-50  MCG/DOSE Aepb Commonly known as:  ADVAIR Inhale 1 puff into the lungs 2 (two) times daily as needed (for wheezing (respiratory issues)).   lansoprazole 15 MG capsule Commonly known as:  PREVACID Take 15 mg by mouth at bedtime.   metoprolol succinate 25 MG 24 hr tablet Commonly known as:  TOPROL-XL Take 25 mg by mouth daily.   ONE TOUCH ULTRA TEST test strip Generic drug:  glucose blood 1 each by Other route See admin instructions. Up to twice daily (scheduled checks every morning)   pravastatin 20 MG tablet Commonly known as:  PRAVACHOL Take 20 mg by mouth at bedtime.   PROAIR HFA 108  (90 Base) MCG/ACT inhaler Generic drug:  albuterol Inhale 2 puffs into the lungs every 6 (six) hours as needed for wheezing.   sertraline 100 MG tablet Commonly known as:  ZOLOFT Take 100 mg by mouth daily.   SYNTHROID 75 MCG tablet Generic drug:  levothyroxine 1 BY MOUTH EVERY MORNING , DO NOT SUBSTITUTE PLEASE.Marland Kitchen REPLACESDISCONTINUED LEVOXYL   SYSTANE OP Place 1-2 drops into both eyes 3 (three) times daily as needed (for dry eyes).   TRADJENTA 5 MG Tabs tablet Generic drug:  linagliptin Take 5 mg by mouth daily.   traZODone 50 MG tablet Commonly known as:  DESYREL Take 50 mg by mouth at bedtime.   Vitamin D (Ergocalciferol) 50000 units Caps capsule Commonly known as:  DRISDOL Take 50,000 Units by mouth every Saturday.   zoledronic acid 5 MG/100ML Soln injection Commonly known as:  RECLAST 5 mg once.       Allergies:  Allergies  Allergen Reactions  . Shellfish Allergy Hives and Swelling  . Iodinated Diagnostic Agents Rash  . Wheat Bran Other (See Comments)    Family History: Family History  Problem Relation Age of Onset  . Arthritis Mother   . Hyperlipidemia Mother   . Hypertension Mother   . Kidney disease Mother   . Diabetes Mother   . Cancer Mother        colon  . Cancer Father        lung  . Bladder Cancer Neg Hx   . Kidney cancer Neg Hx   . Prostate cancer Neg Hx     Social History:  reports that  has never smoked. she has never used smokeless tobacco. She reports that she does not drink alcohol or use drugs.  ROS: 12 point review of systems is performed is negative other than as above.  Physical Exam: BP 134/80   Pulse 65   Ht 5\' 2"  (1.575 m)   Wt 167 lb (75.8 kg)   BMI 30.54 kg/m   Constitutional:  Alert and oriented, No acute distress. HEENT: [place] AT, moist mucus membranes.  Trachea midline, no masses. Cardiovascular: No clubbing, cyanosis, or edema. Respiratory: Normal respiratory effort, no increased work of breathing. GI: Abdomen is  soft, nontender, nondistended, no abdominal masses GU: No CVA tenderness Skin: No rashes, bruises or suspicious lesions. Neurologic: Grossly intact, no focal deficits, moving all 4 extremities. Psychiatric: Normal mood and affect.  Laboratory Data: Lab Results  Component Value Date   WBC 8.0 09/14/2016   HGB 12.4 09/14/2016   HCT 37.3 09/14/2016   MCV 93 09/14/2016   PLT 218 09/14/2016    Lab Results  Component Value Date   CREATININE 1.81 (H) 10/05/2016     Lab Results  Component Value Date   HGBA1C 5.2 10/09/2012    Urinalysis Results for orders placed or performed  in visit on 09/20/17  Microscopic Examination  Result Value Ref Range   WBC, UA >30 (H) 0 - 5 /hpf   RBC, UA 0-2 0 - 2 /hpf   Epithelial Cells (non renal) 0-10 0 - 10 /hpf   Renal Epithel, UA 0-10 (A) None seen /hpf   Casts Present (A) None seen /lpf   Cast Type Hyaline casts N/A   Crystals Present (A) N/A   Crystal Type Calcium Oxalate N/A   Mucus, UA Present (A) Not Estab.   Bacteria, UA Moderate (A) None seen/Few  Urinalysis, Complete  Result Value Ref Range   Specific Gravity, UA 1.025 1.005 - 1.030   pH, UA 6.0 5.0 - 7.5   Color, UA Yellow Yellow   Appearance Ur Cloudy (A) Clear   Leukocytes, UA 2+ (A) Negative   Protein, UA 2+ (A) Negative/Trace   Glucose, UA Negative Negative   Ketones, UA Trace (A) Negative   RBC, UA Trace (A) Negative   Bilirubin, UA Negative Negative   Urobilinogen, Ur 0.2 0.2 - 1.0 mg/dL   Nitrite, UA Negative Negative   Microscopic Examination See below:     Pertinent Imaging: CLINICAL DATA:  Nephrolithiasis.  EXAM: ABDOMEN - 1 VIEW  COMPARISON:  Radiographs of March 22, 2017 and March 04, 2017. CT scan of September 09, 2016. Ultrasound of May 11, 2017.  FINDINGS: The bowel gas pattern is normal. No definite evidence of nephrolithiasis. Status post cholecystectomy. Stable rounded calcification is seen in right pelvis which may represent  distal ureteral calculus.  IMPRESSION: No evidence of bowel obstruction or ileus. No definite nephrolithiasis is noted. Stable rounded calcification seen in right pelvis which may represent distal right ureteral calculus.   Electronically Signed   By: Marijo Conception, M.D.   On: 09/20/2017 14:08  KUB personally reviewed today and personally compared to KUB in 03/2017  Assessment & Plan:    1. History of kidney stones H/o kidney stones KUB today with stable possible right distal ureteral stone Denies flank pain or signs or symptoms of infection Given that it is unclear whether or not this is a retained stone, strongly recommend noncontrast CT scan If the calcification of the pelvis represents a ureteral stone, I would recommend ureteroscopy as primary intervention. Risks and benefits of ureteroscopy were reviewed including but not limited to infection, bleeding, pain, ureteral injury which could require open surgery versus prolonged indwelling if ureteral perforation occurs, persistent stone disease, requirement for staged procedure, possible stent, and global anesthesia risk  We will book surgery as needed .  - Urinalysis, Complete - CT RENAL STONE STUDY; Future  2. Pyuria Asymptomatic pyuria We will send culture to rule out infection - CULTURE, URINE COMPREHENSIVE  Will call with CT results and book surgery if needed  Hollice Espy, MD  [facility] [address] [place], [place] [address] [phone]

## 2017-09-23 LAB — CULTURE, URINE COMPREHENSIVE

## 2017-09-26 ENCOUNTER — Telehealth: Payer: Self-pay

## 2017-09-26 MED ORDER — CIPROFLOXACIN HCL 500 MG PO TABS: 500.0000 mg | ORAL_TABLET | Freq: Two times a day (BID) | ORAL | 0 refills | Status: AC

## 2017-09-26 NOTE — Telephone Encounter (Signed)
LMOM-medication sent to pharmacy 

## 2017-09-26 NOTE — Telephone Encounter (Signed)
-----   Message from Hollice Espy, MD sent at 09/25/2017  3:07 PM EDT ----- +UCx.  Please treat with cipro 500 mg bid x 7 days.    Reviewed warning symptoms and indications to present to the ED.    Hollice Espy, MD

## 2017-09-27 NOTE — Telephone Encounter (Signed)
LMOM

## 2017-09-27 NOTE — Telephone Encounter (Signed)
Pt called back and stated she picked up the medication.

## 2018-03-09 IMAGING — CR DG ABDOMEN 1V
2 series · 2 of 2 positions shown · non-contrast
Comparison: CT 09/09/2016.

CLINICAL DATA: Right flank pain for 1 week. History of kidney
stones with lithotripsy.

EXAM:
ABDOMEN - 1 VIEW

[abdomen kub (1 of 2)]
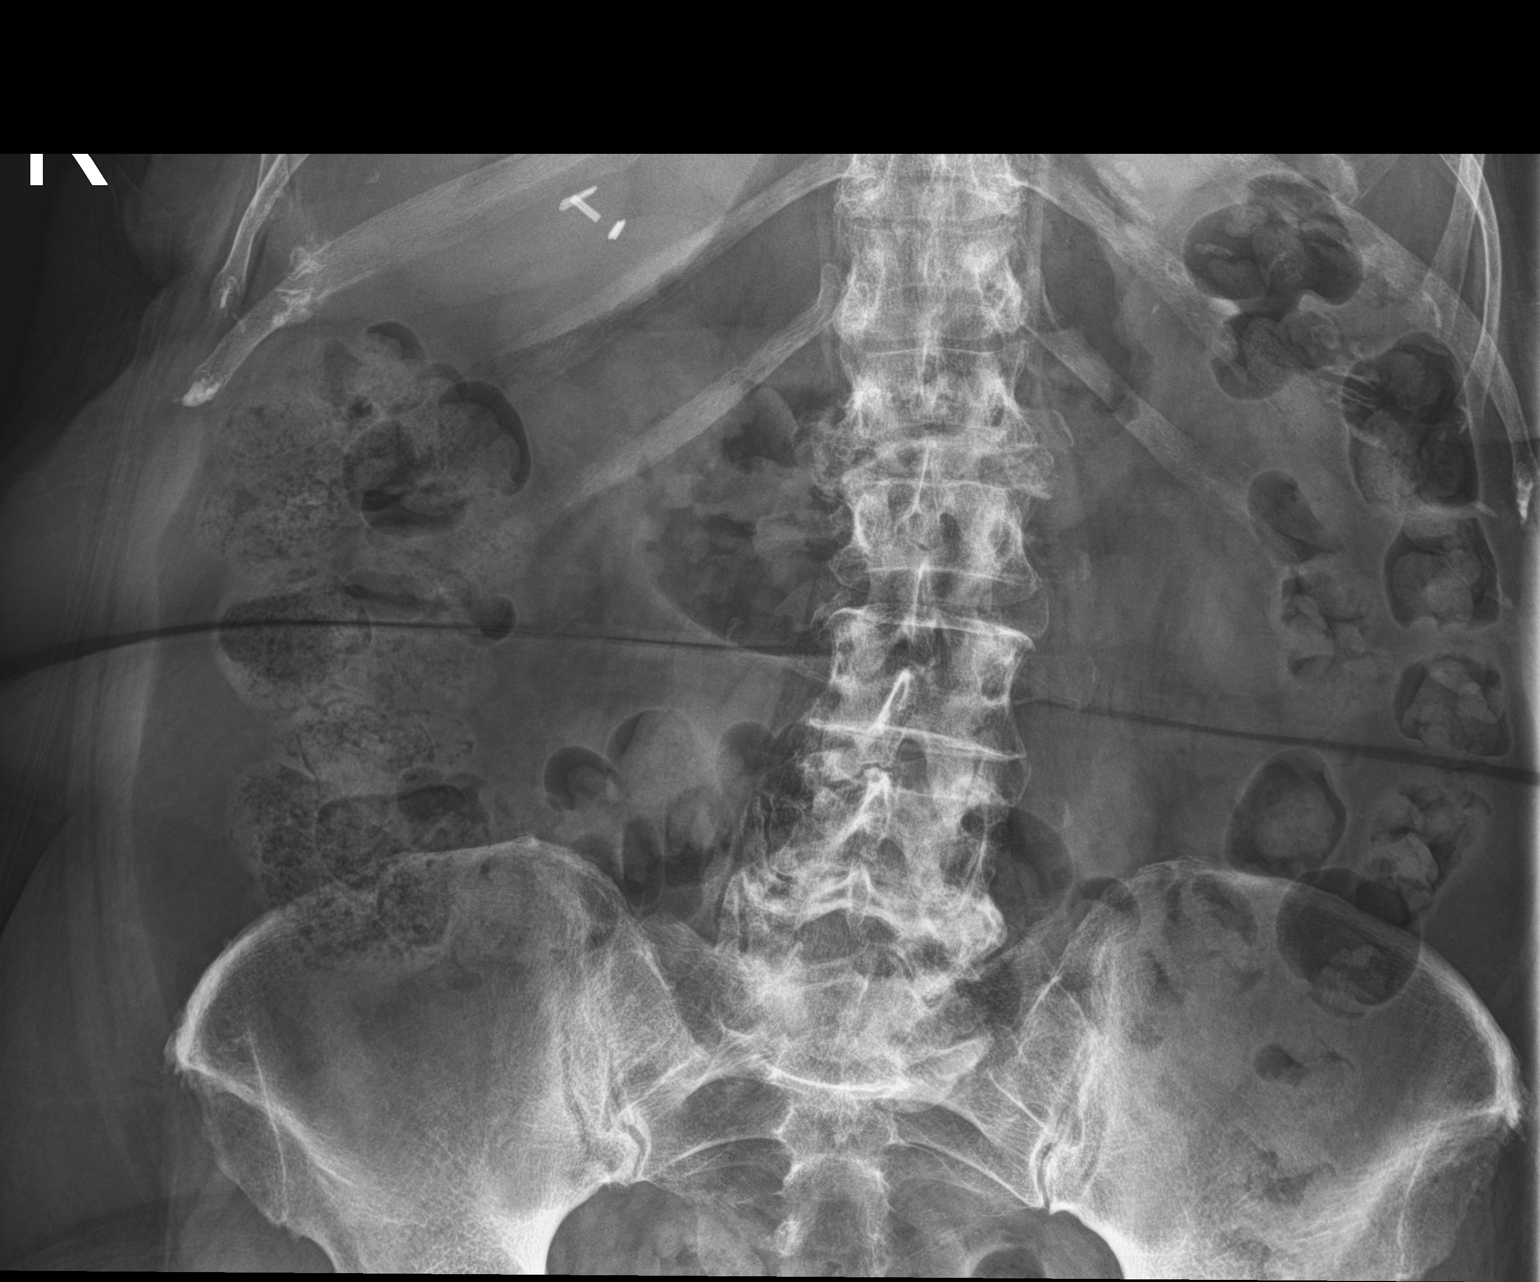

[abdomen kub (2 of 2)]
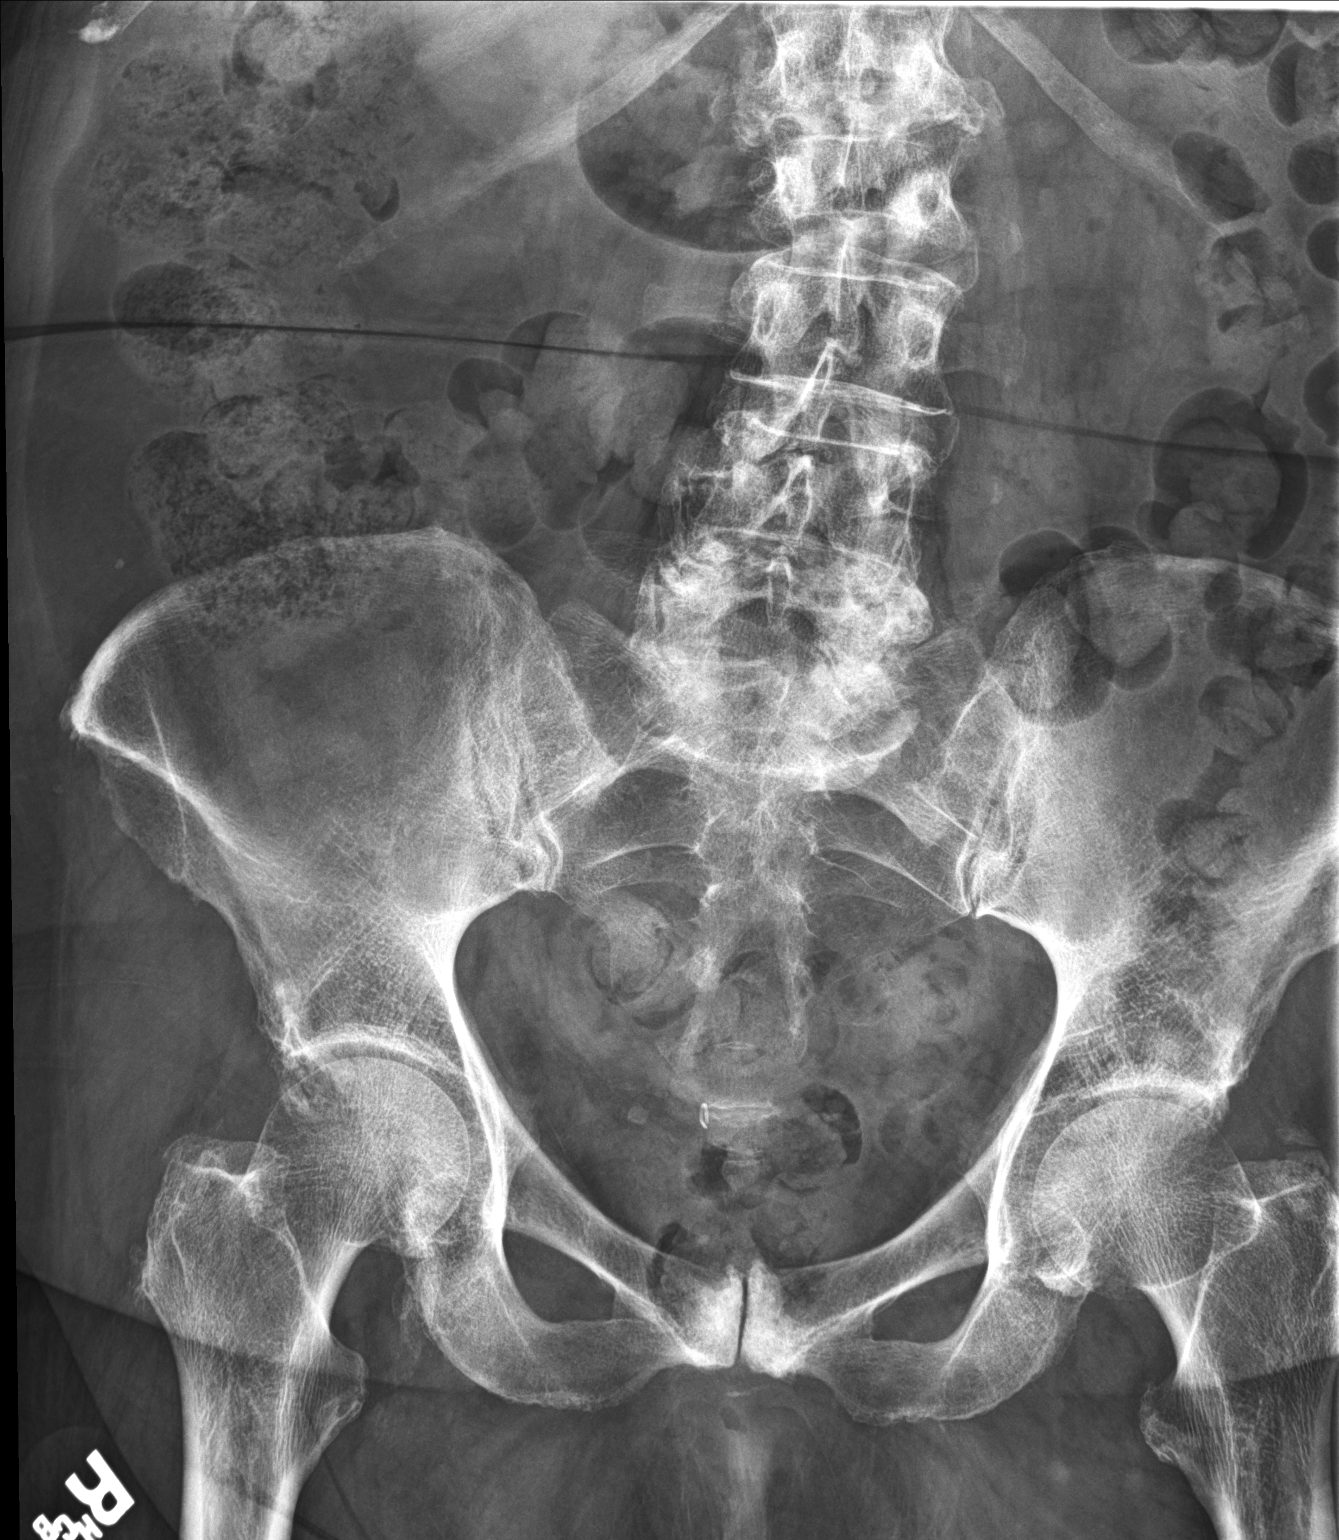

[2 of 2 positions shown; findings below may reference images not displayed]

FINDINGS: The bowel gas pattern is nonobstructive. There is moderate stool
throughout the colon. Prominent right renal calculi seen on prior CT
are not clearly demonstrated. There is a new calcification in the
right pelvis measuring 4 mm, lateral to a surgical clip. This is
suspicious for distal ureteral calculus. There are degenerative
changes throughout the lumbar spine and in the symphysis pubis.
Cholecystectomy clips are noted.
IMPRESSION: Apparent new right pelvic calcification, suspicious for a distal
right ureteral calculus. Consider CT for further evaluation.

## 2018-03-27 IMAGING — CR DG ABDOMEN 1V
1 series · 2 of 2 positions shown · non-contrast
Comparison: Radiographs March 04, 2017.

CLINICAL DATA: Right kidney stones.

EXAM:
ABDOMEN - 1 VIEW

[Series 1: dg abd 1 view · 0.14mm/px · 2 of 2 slices shown]
[im 1/2]
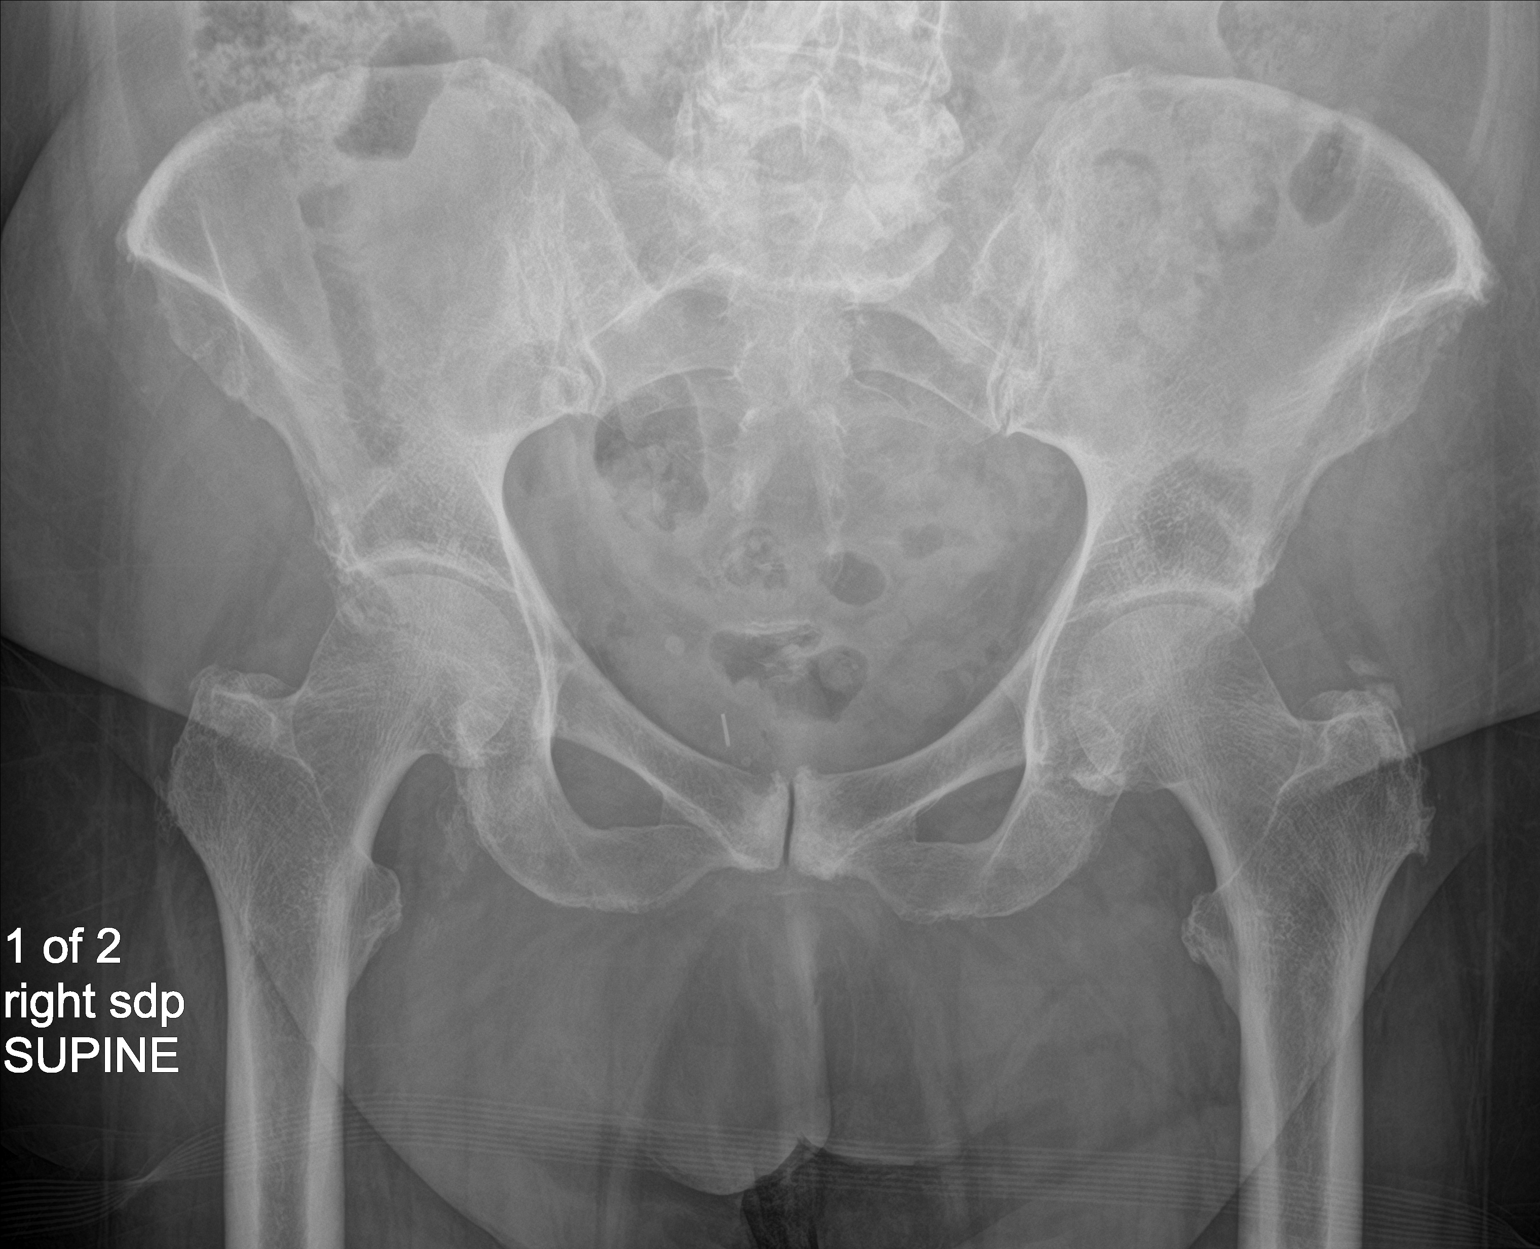
[im 2/2]
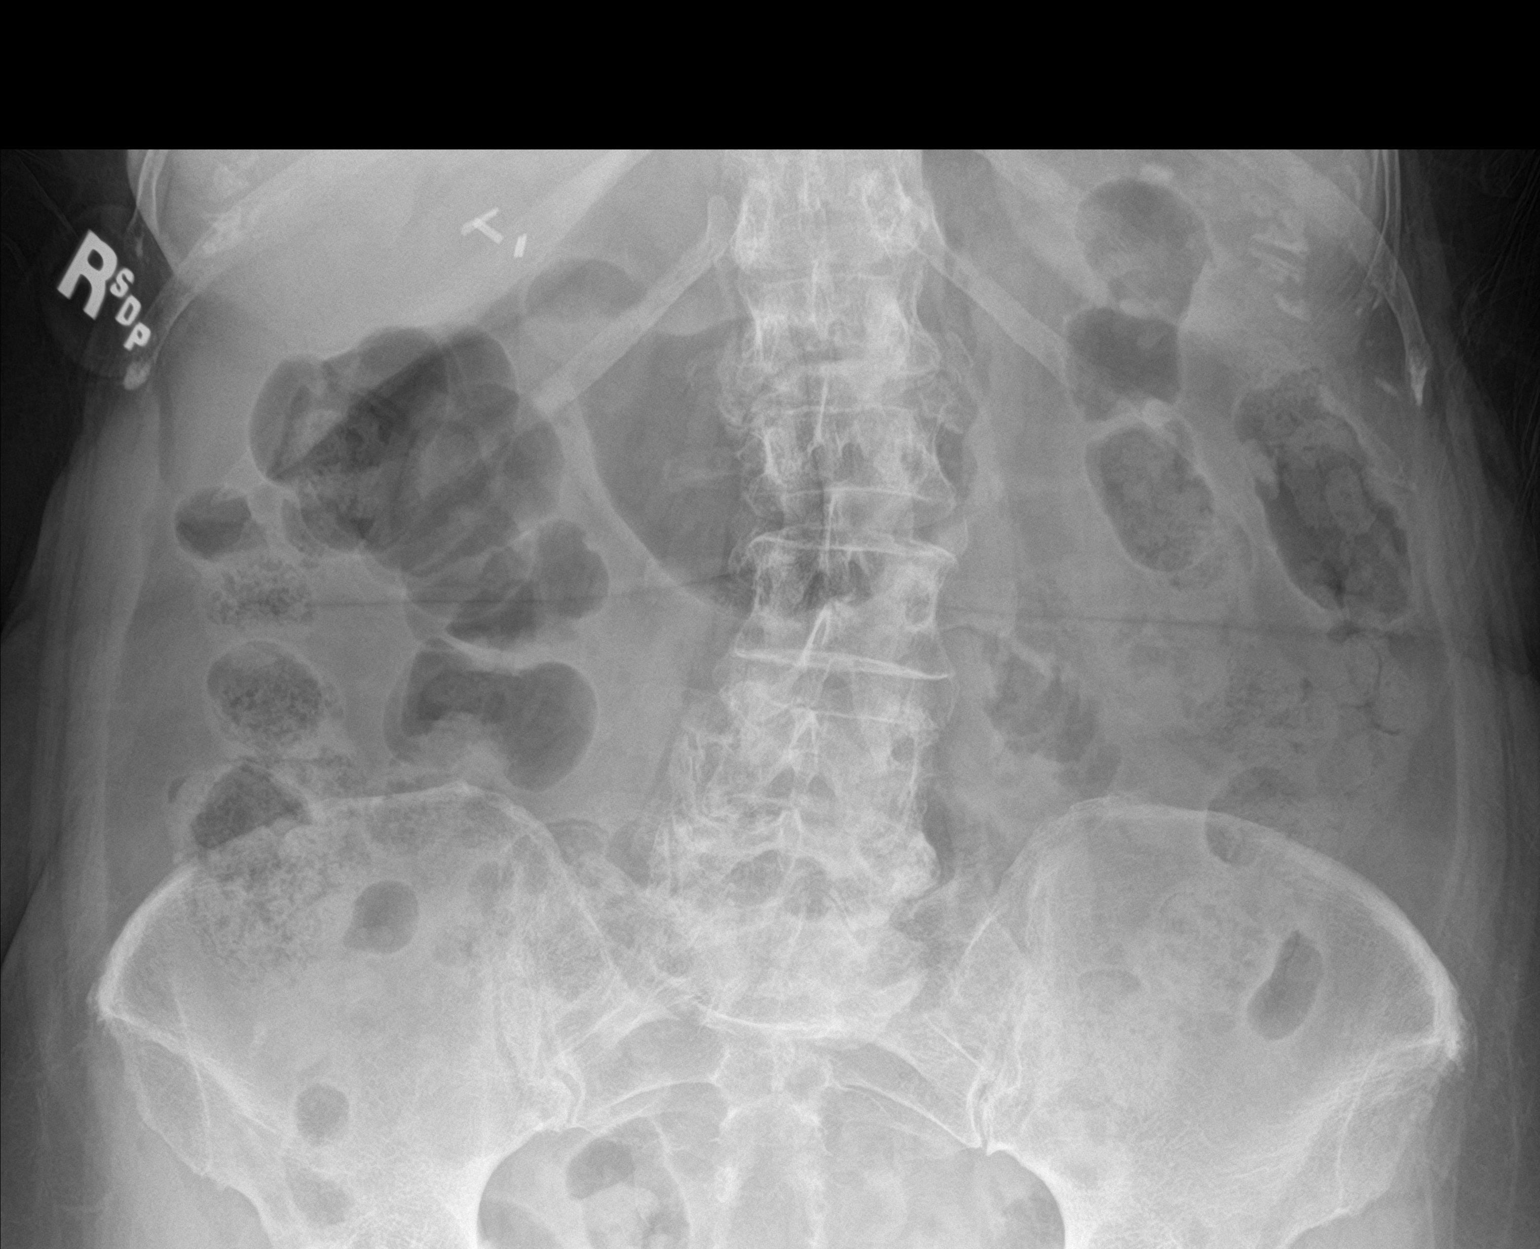

[2 of 2 positions shown; findings below may reference images not displayed]

FINDINGS: The bowel gas pattern is normal. Status post cholecystectomy.
Rounded calcification seen in right pelvis is unchanged compared to
prior exam concerning for ureteral calculus.
IMPRESSION: Stable rounded calcification seen in right pelvis which may
represent distal ureteral calculus. CT urogram may be performed
further evaluation. No evidence of bowel obstruction or ileus.

## 2018-08-07 IMAGING — US US RENAL
1 series · 14 of 25 positions shown · non-contrast
Comparison: CT scan of September 09, 2016.

CLINICAL DATA: Nephrolithiasis.

EXAM:
RENAL / URINARY TRACT ULTRASOUND COMPLETE

[Series 1: us renal · 0.22mm/px · 14 of 41 slices shown]
[im 1/41]
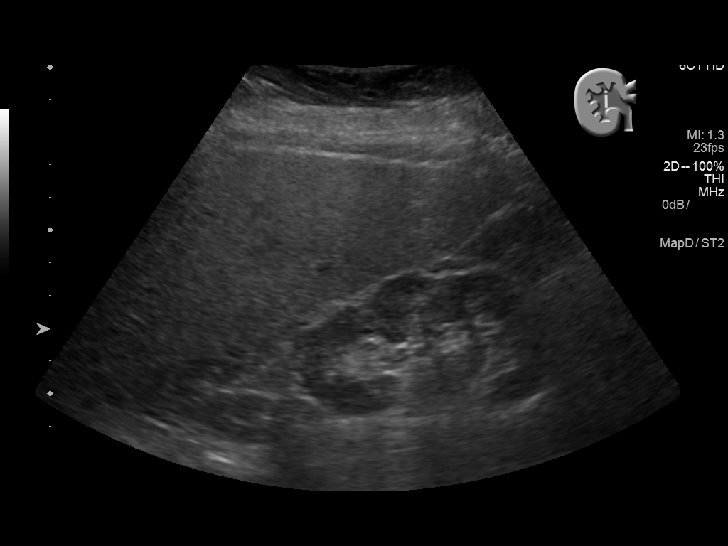
[im 4/41]
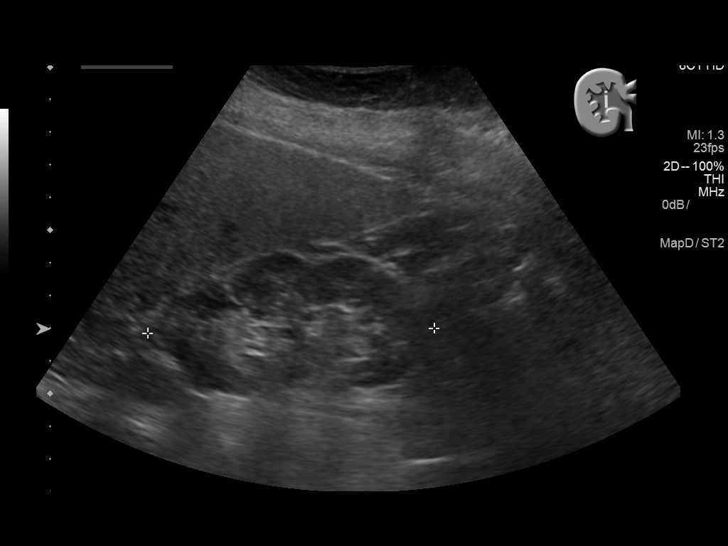
[im 7/41]
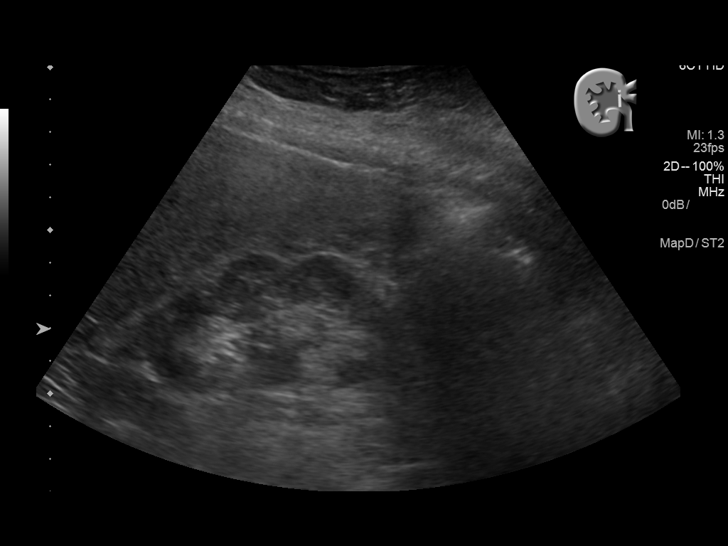
[im 11/41]
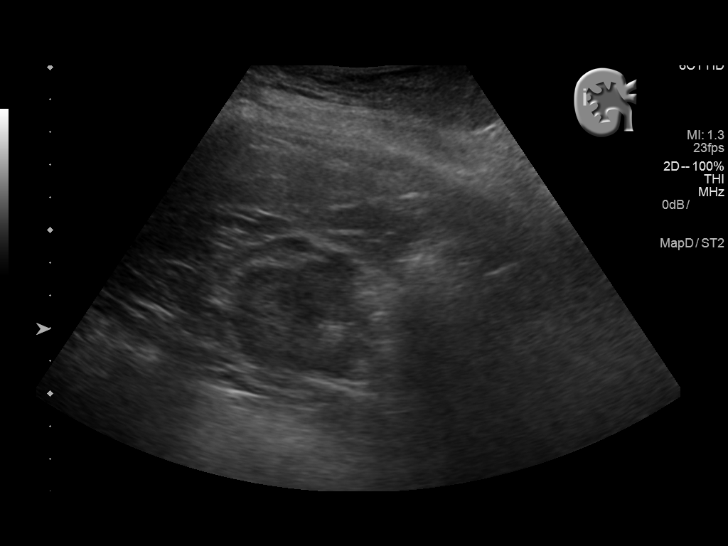
[im 14/41]
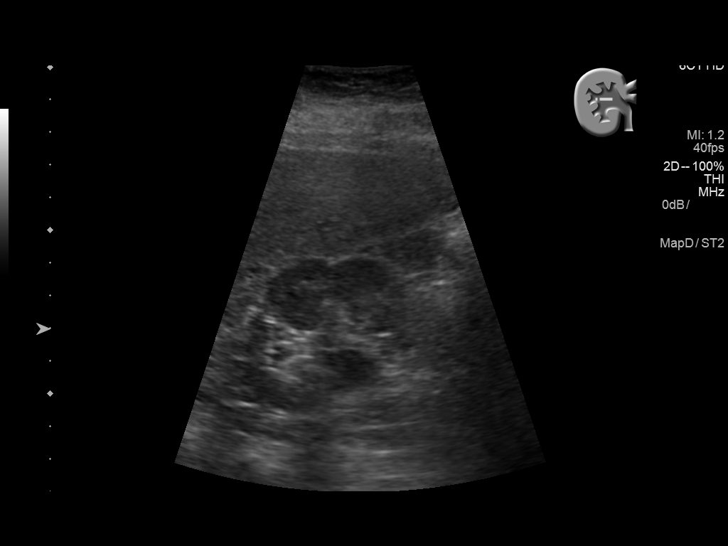
[im 16/41]
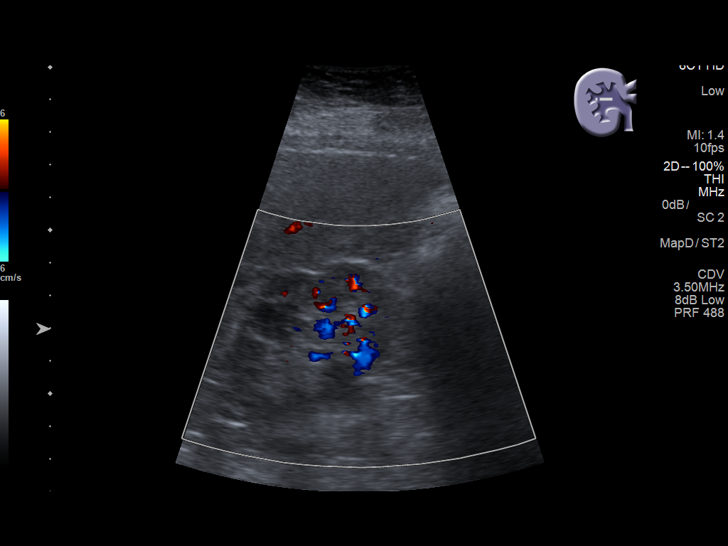
[im 19/41]
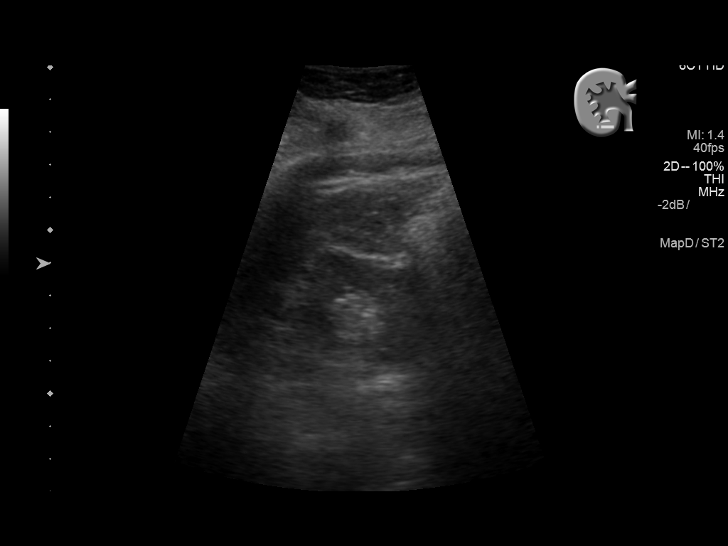
[im 22/41]
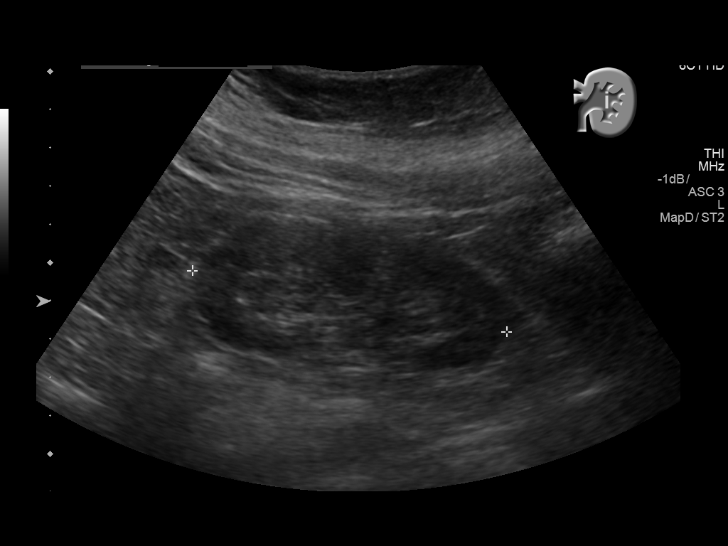
[im 26/41]
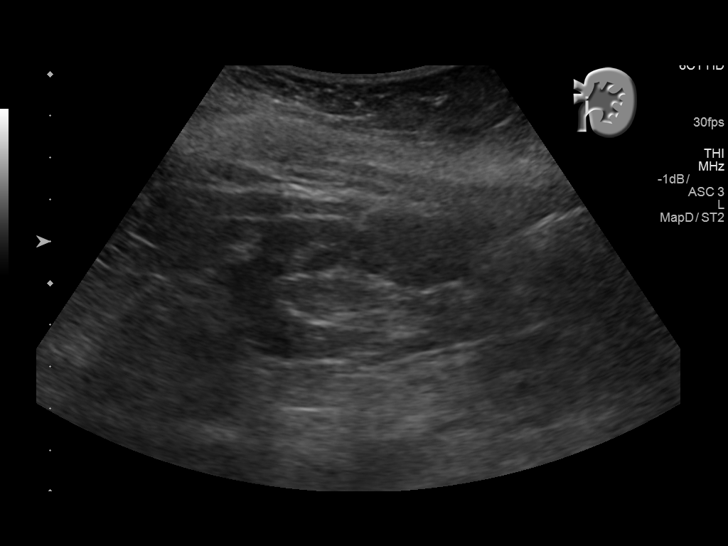
[im 27/41]
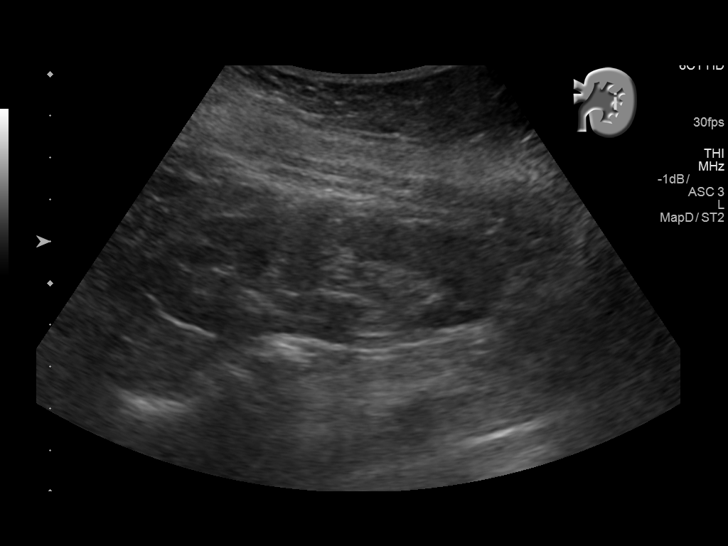
[im 31/41]
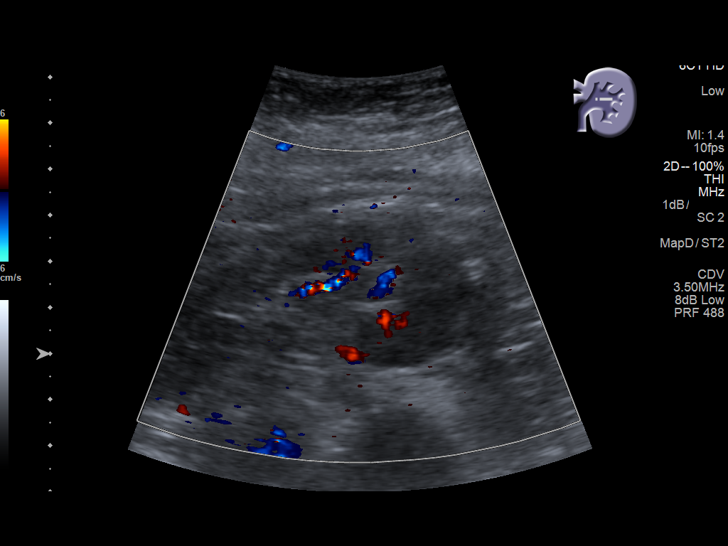
[im 34/41]
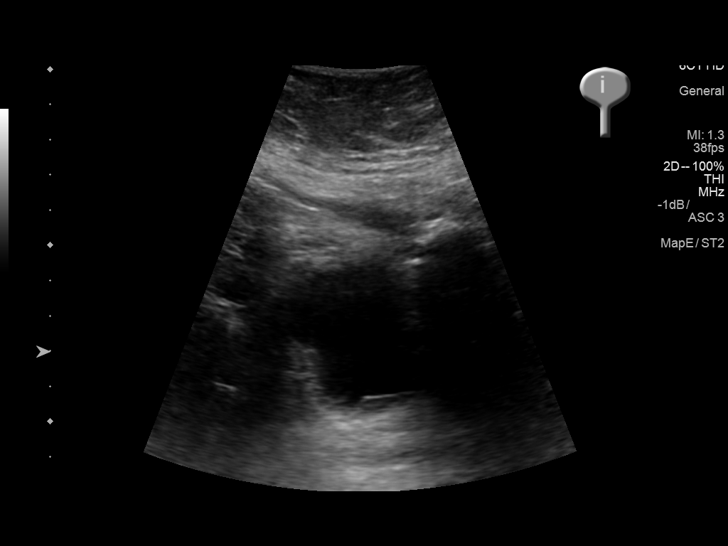
[im 37/41]
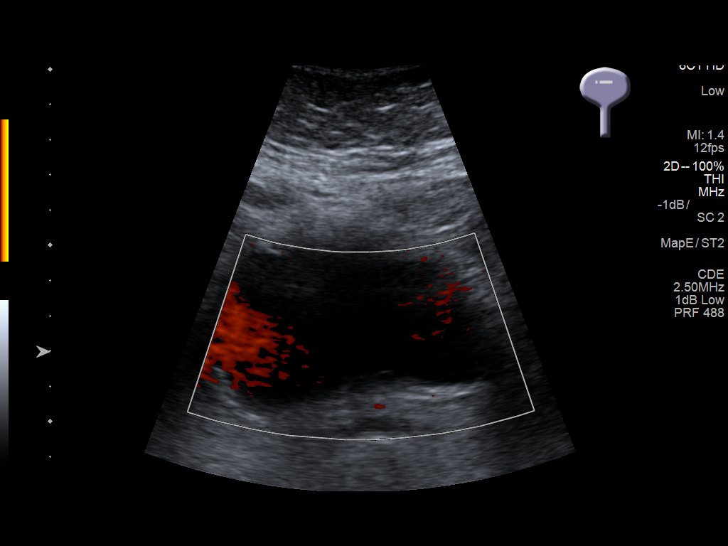
[im 41/41]
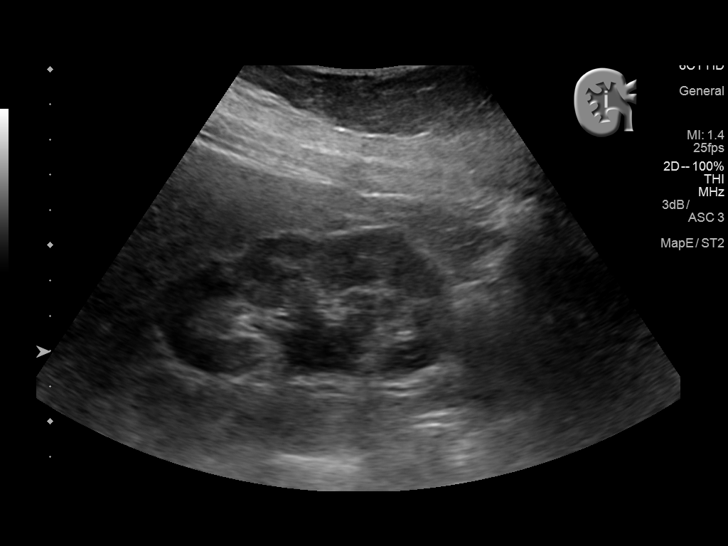

[14 of 25 positions shown; findings below may reference images not displayed]

FINDINGS: Right Kidney:

Length: 8.8 cm. Cortical scarring is noted. Mild hydronephrosis is
noted. Echogenicity within normal limits. No mass visualized.

Left Kidney:

Length: 8.8 cm. Echogenicity within normal limits. No mass or
hydronephrosis visualized.

Bladder:

Appears normal for degree of bladder distention.
IMPRESSION: Mild bilateral renal atrophy is noted. Right renal cortical scarring
is noted. Mild right hydronephrosis is noted.

## 2019-02-10 IMAGING — US US RENAL
2 series · 14 of 25 positions shown · non-contrast
Comparison: Plain films of 03/22/2017.  CT of 09/09/2016.

CLINICAL DATA: Right flank pain. Pelvic calcifications on plain
film.

EXAM:
RENAL / URINARY TRACT ULTRASOUND COMPLETE

[Series 1: us renal · 0.23mm/px · 11 of 38 slices shown (1 of 2)]
[im 1/38]
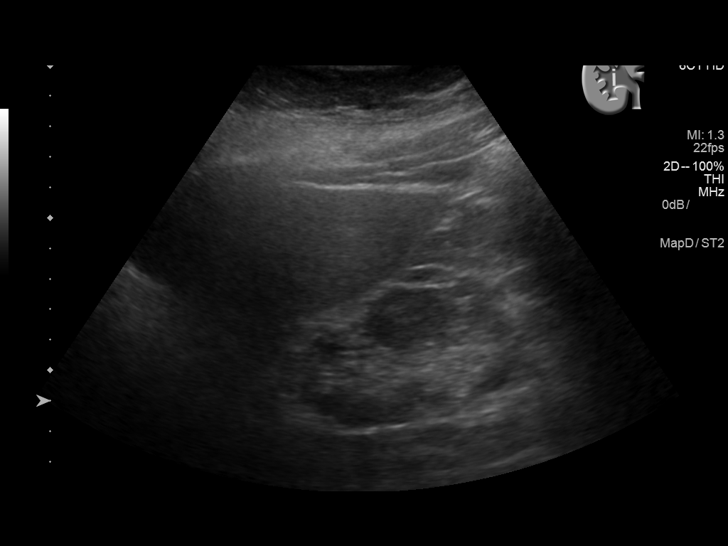
[im 4/38]
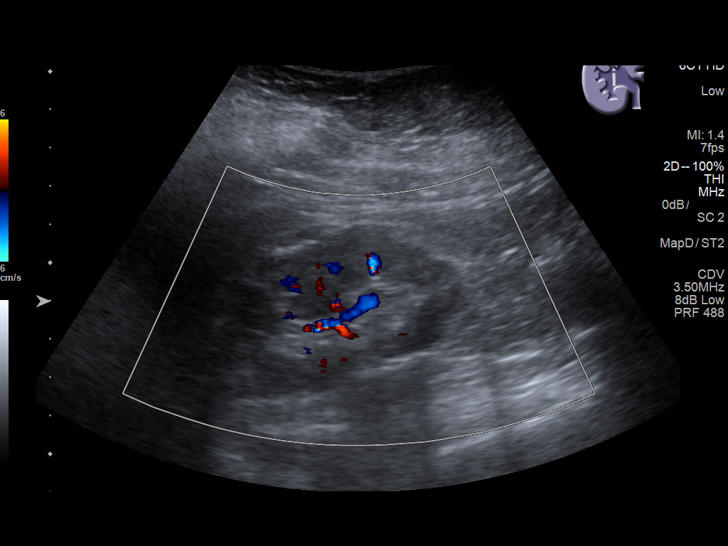
[im 8/38]
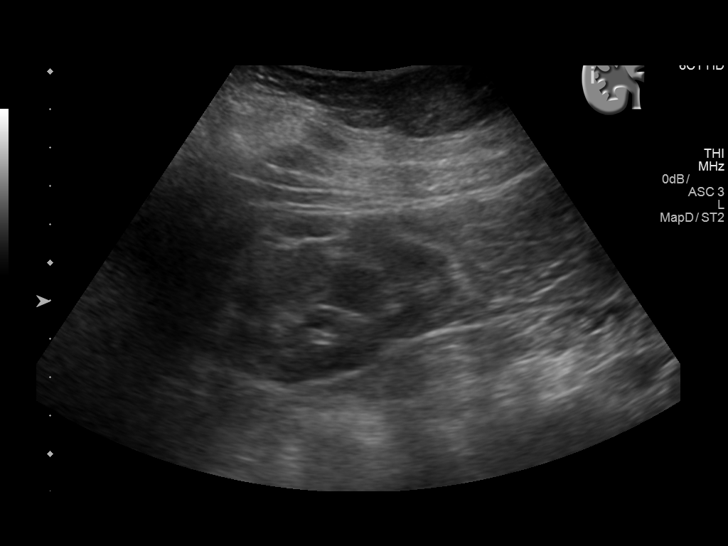
[im 12/38]
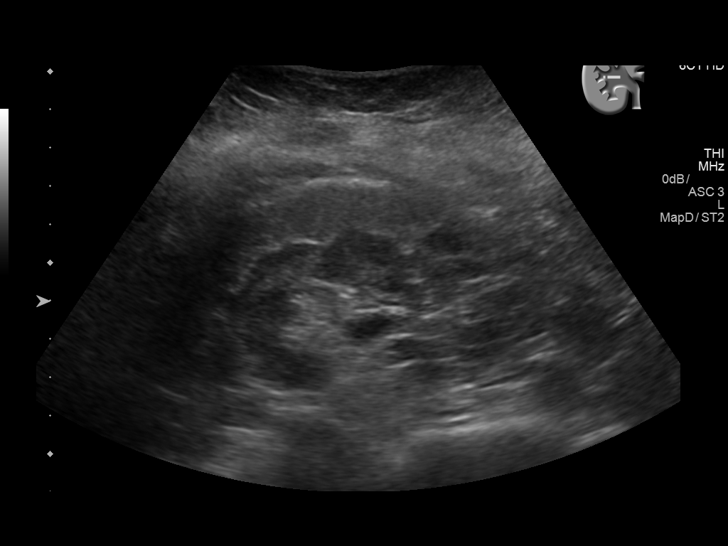
[im 16/38]
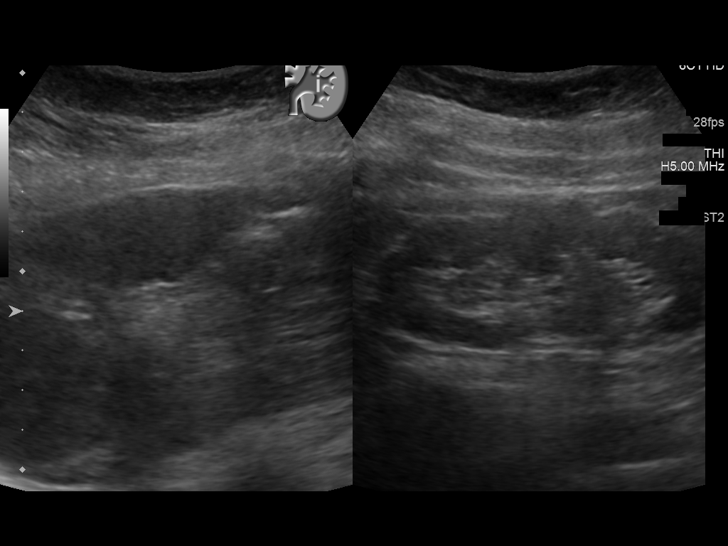
[im 18/38]
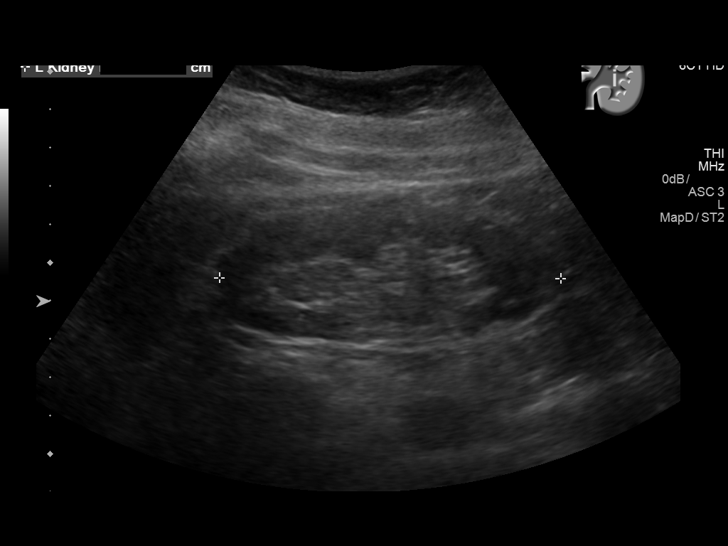
[im 22/38]
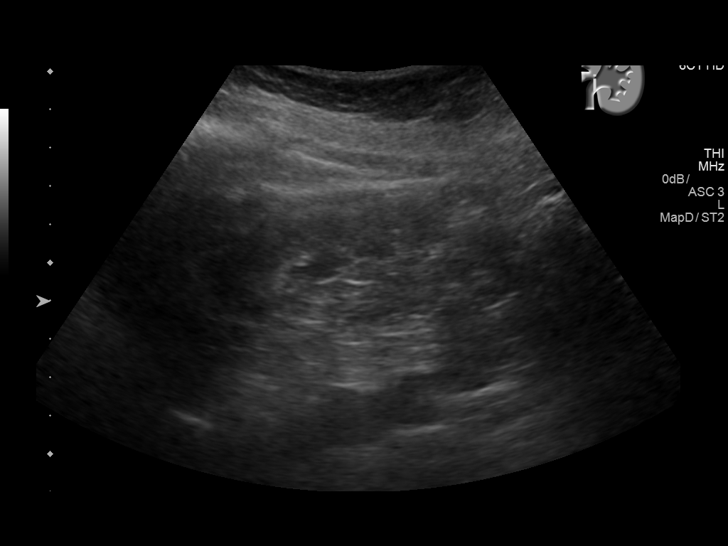
[im 26/38]
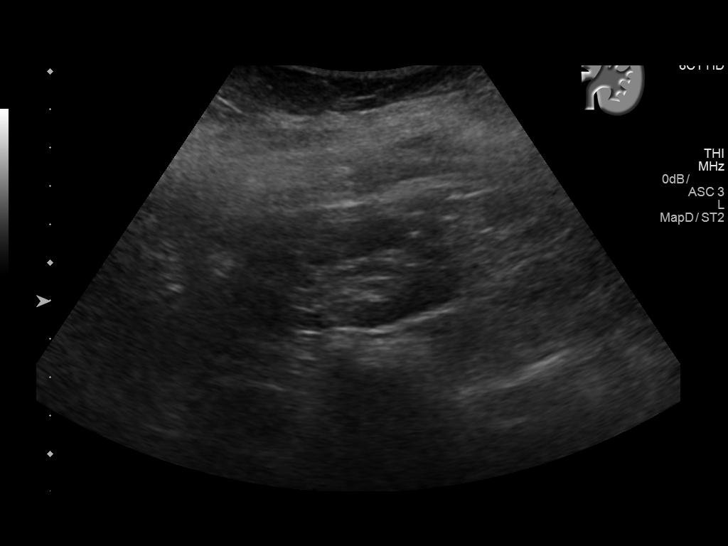
[im 30/38]
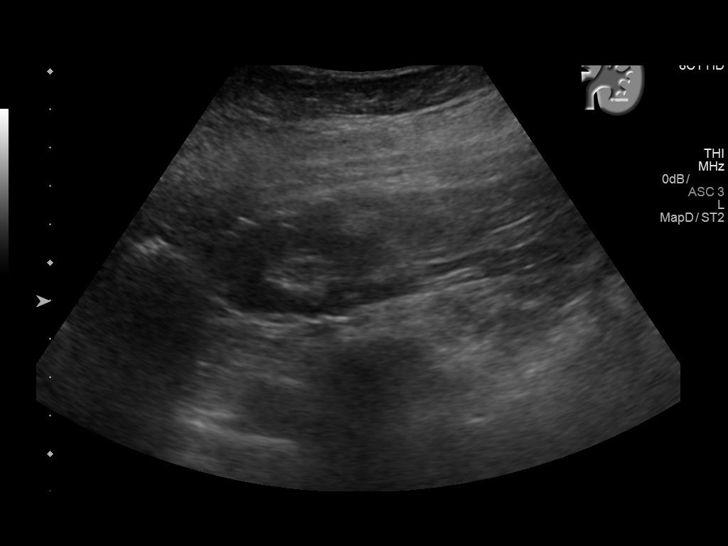
[im 32/38]
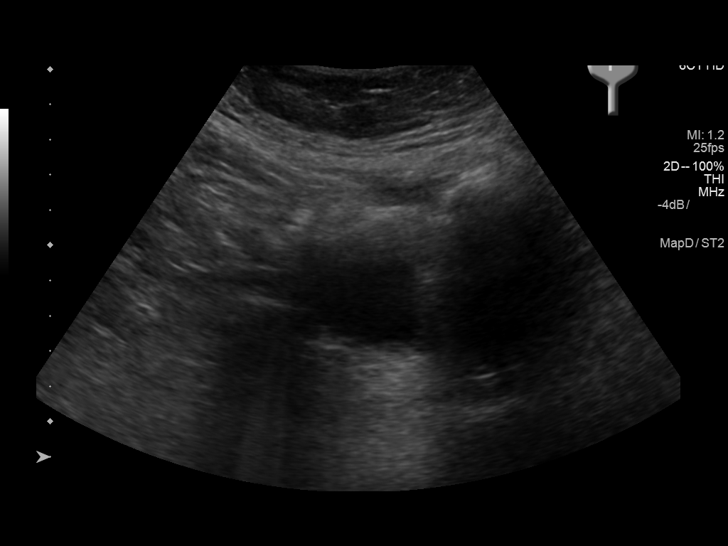
[im 36/38]
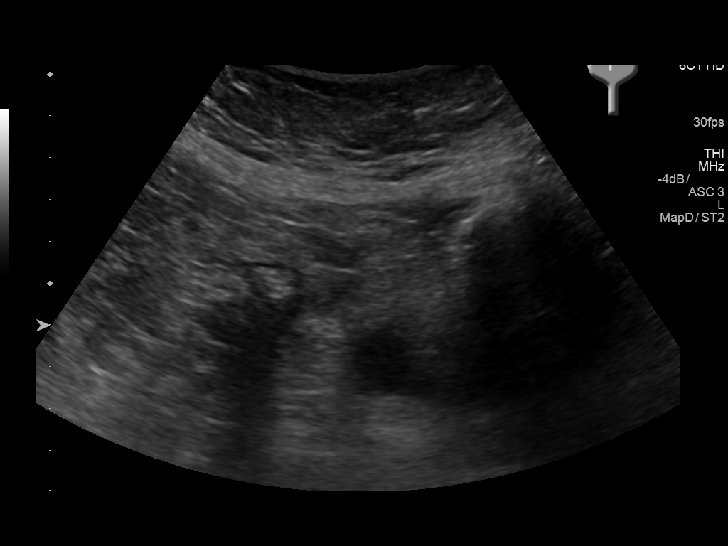

[Series 2001: us renal · 0.23mm/px · 3 of 10 slices shown (2 of 2)]
[im 1/10]
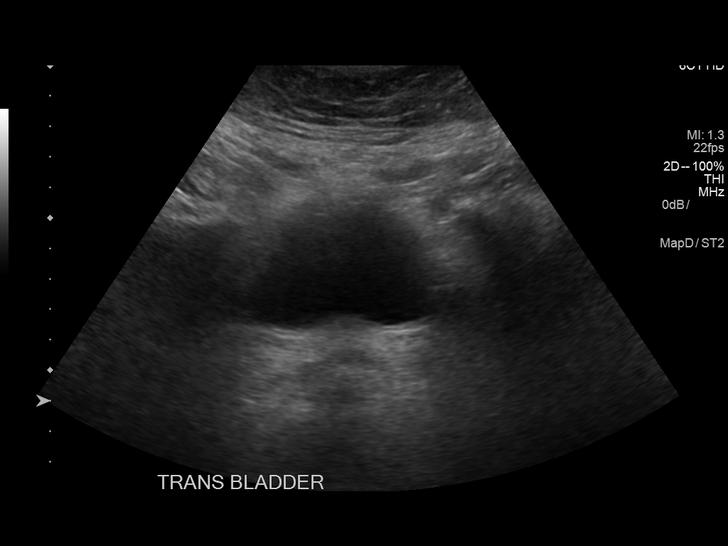
[im 5/10]
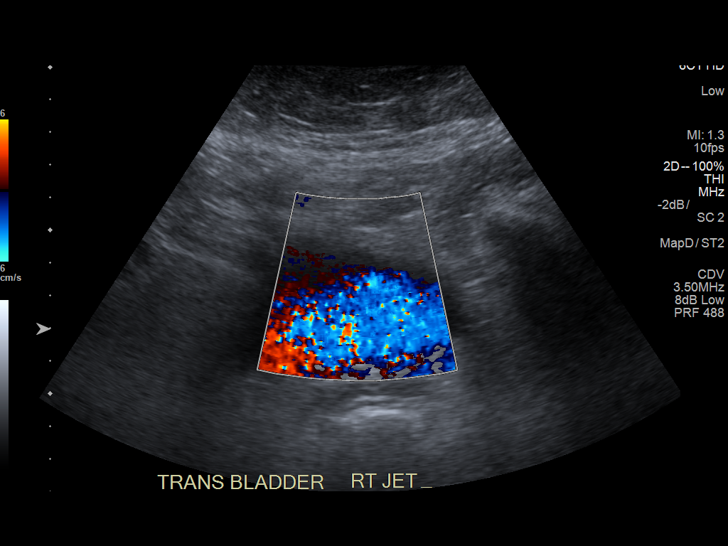
[im 10/10]
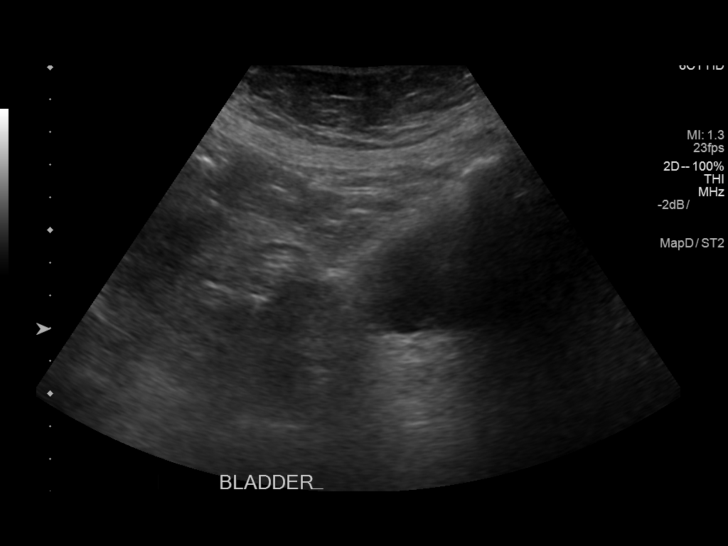

[14 of 25 positions shown; findings below may reference images not displayed]

FINDINGS: Right Kidney:

Length: 8.5 cm. Echogenicity within normal limits. No mass or
hydronephrosis visualized.

Left Kidney:

Length: 8.9 cm. Echogenicity within normal limits. No mass or
hydronephrosis visualized.

Bladder:

Appears normal for degree of bladder distention.
IMPRESSION: Normal renal ultrasound. No hydronephrosis or explanation for right
flank pain. If high clinical concern of distal ureteric stone,
recommend dedicated CT urogram.

## 2020-07-17 DIAGNOSIS — I10 Essential (primary) hypertension: Secondary | ICD-10-CM | POA: Diagnosis not present

## 2020-07-17 DIAGNOSIS — G4701 Insomnia due to medical condition: Secondary | ICD-10-CM | POA: Diagnosis not present

## 2020-07-17 DIAGNOSIS — M810 Age-related osteoporosis without current pathological fracture: Secondary | ICD-10-CM | POA: Diagnosis not present

## 2020-07-17 DIAGNOSIS — J3081 Allergic rhinitis due to animal (cat) (dog) hair and dander: Secondary | ICD-10-CM | POA: Diagnosis not present

## 2020-07-17 DIAGNOSIS — N189 Chronic kidney disease, unspecified: Secondary | ICD-10-CM | POA: Diagnosis not present

## 2020-07-17 DIAGNOSIS — J3089 Other allergic rhinitis: Secondary | ICD-10-CM | POA: Diagnosis not present

## 2020-07-17 DIAGNOSIS — E559 Vitamin D deficiency, unspecified: Secondary | ICD-10-CM | POA: Diagnosis not present

## 2020-07-17 DIAGNOSIS — J301 Allergic rhinitis due to pollen: Secondary | ICD-10-CM | POA: Diagnosis not present

## 2020-07-17 DIAGNOSIS — E1165 Type 2 diabetes mellitus with hyperglycemia: Secondary | ICD-10-CM | POA: Diagnosis not present

## 2020-07-17 DIAGNOSIS — R69 Illness, unspecified: Secondary | ICD-10-CM | POA: Diagnosis not present

## 2020-07-17 DIAGNOSIS — E7800 Pure hypercholesterolemia, unspecified: Secondary | ICD-10-CM | POA: Diagnosis not present

## 2020-07-17 DIAGNOSIS — E039 Hypothyroidism, unspecified: Secondary | ICD-10-CM | POA: Diagnosis not present

## 2020-07-17 DIAGNOSIS — I493 Ventricular premature depolarization: Secondary | ICD-10-CM | POA: Diagnosis not present

## 2020-07-21 DIAGNOSIS — Z1231 Encounter for screening mammogram for malignant neoplasm of breast: Secondary | ICD-10-CM | POA: Diagnosis not present

## 2020-07-30 DIAGNOSIS — M810 Age-related osteoporosis without current pathological fracture: Secondary | ICD-10-CM | POA: Diagnosis not present

## 2020-07-30 DIAGNOSIS — E669 Obesity, unspecified: Secondary | ICD-10-CM | POA: Diagnosis not present

## 2020-07-30 DIAGNOSIS — N189 Chronic kidney disease, unspecified: Secondary | ICD-10-CM | POA: Diagnosis not present

## 2020-07-30 DIAGNOSIS — R69 Illness, unspecified: Secondary | ICD-10-CM | POA: Diagnosis not present

## 2020-07-30 DIAGNOSIS — I493 Ventricular premature depolarization: Secondary | ICD-10-CM | POA: Diagnosis not present

## 2020-07-30 DIAGNOSIS — E790 Hyperuricemia without signs of inflammatory arthritis and tophaceous disease: Secondary | ICD-10-CM | POA: Diagnosis not present

## 2020-07-30 DIAGNOSIS — E1165 Type 2 diabetes mellitus with hyperglycemia: Secondary | ICD-10-CM | POA: Diagnosis not present

## 2020-07-30 DIAGNOSIS — E559 Vitamin D deficiency, unspecified: Secondary | ICD-10-CM | POA: Diagnosis not present

## 2020-07-30 DIAGNOSIS — E7800 Pure hypercholesterolemia, unspecified: Secondary | ICD-10-CM | POA: Diagnosis not present

## 2020-07-30 DIAGNOSIS — I10 Essential (primary) hypertension: Secondary | ICD-10-CM | POA: Diagnosis not present

## 2020-07-31 DIAGNOSIS — J3081 Allergic rhinitis due to animal (cat) (dog) hair and dander: Secondary | ICD-10-CM | POA: Diagnosis not present

## 2020-07-31 DIAGNOSIS — J3089 Other allergic rhinitis: Secondary | ICD-10-CM | POA: Diagnosis not present

## 2020-07-31 DIAGNOSIS — J301 Allergic rhinitis due to pollen: Secondary | ICD-10-CM | POA: Diagnosis not present

## 2020-08-07 DIAGNOSIS — J301 Allergic rhinitis due to pollen: Secondary | ICD-10-CM | POA: Diagnosis not present

## 2020-08-07 DIAGNOSIS — J3089 Other allergic rhinitis: Secondary | ICD-10-CM | POA: Diagnosis not present

## 2020-08-07 DIAGNOSIS — J3081 Allergic rhinitis due to animal (cat) (dog) hair and dander: Secondary | ICD-10-CM | POA: Diagnosis not present

## 2020-08-21 DIAGNOSIS — J3081 Allergic rhinitis due to animal (cat) (dog) hair and dander: Secondary | ICD-10-CM | POA: Diagnosis not present

## 2020-08-21 DIAGNOSIS — J3089 Other allergic rhinitis: Secondary | ICD-10-CM | POA: Diagnosis not present

## 2020-08-21 DIAGNOSIS — J301 Allergic rhinitis due to pollen: Secondary | ICD-10-CM | POA: Diagnosis not present

## 2020-08-28 DIAGNOSIS — J301 Allergic rhinitis due to pollen: Secondary | ICD-10-CM | POA: Diagnosis not present

## 2020-08-28 DIAGNOSIS — J3089 Other allergic rhinitis: Secondary | ICD-10-CM | POA: Diagnosis not present

## 2020-08-28 DIAGNOSIS — J3081 Allergic rhinitis due to animal (cat) (dog) hair and dander: Secondary | ICD-10-CM | POA: Diagnosis not present

## 2020-09-11 DIAGNOSIS — J3081 Allergic rhinitis due to animal (cat) (dog) hair and dander: Secondary | ICD-10-CM | POA: Diagnosis not present

## 2020-09-11 DIAGNOSIS — J301 Allergic rhinitis due to pollen: Secondary | ICD-10-CM | POA: Diagnosis not present

## 2020-09-11 DIAGNOSIS — J3089 Other allergic rhinitis: Secondary | ICD-10-CM | POA: Diagnosis not present

## 2020-09-23 DIAGNOSIS — R921 Mammographic calcification found on diagnostic imaging of breast: Secondary | ICD-10-CM | POA: Diagnosis not present

## 2020-09-23 DIAGNOSIS — R928 Other abnormal and inconclusive findings on diagnostic imaging of breast: Secondary | ICD-10-CM | POA: Diagnosis not present

## 2020-09-23 DIAGNOSIS — R922 Inconclusive mammogram: Secondary | ICD-10-CM | POA: Diagnosis not present

## 2020-09-25 DIAGNOSIS — J301 Allergic rhinitis due to pollen: Secondary | ICD-10-CM | POA: Diagnosis not present

## 2020-09-25 DIAGNOSIS — J3081 Allergic rhinitis due to animal (cat) (dog) hair and dander: Secondary | ICD-10-CM | POA: Diagnosis not present

## 2020-09-25 DIAGNOSIS — J3089 Other allergic rhinitis: Secondary | ICD-10-CM | POA: Diagnosis not present

## 2020-10-08 DIAGNOSIS — E790 Hyperuricemia without signs of inflammatory arthritis and tophaceous disease: Secondary | ICD-10-CM | POA: Diagnosis not present

## 2020-10-08 DIAGNOSIS — E782 Mixed hyperlipidemia: Secondary | ICD-10-CM | POA: Diagnosis not present

## 2020-10-08 DIAGNOSIS — E1165 Type 2 diabetes mellitus with hyperglycemia: Secondary | ICD-10-CM | POA: Diagnosis not present

## 2020-10-08 DIAGNOSIS — N189 Chronic kidney disease, unspecified: Secondary | ICD-10-CM | POA: Diagnosis not present

## 2020-10-08 DIAGNOSIS — E7800 Pure hypercholesterolemia, unspecified: Secondary | ICD-10-CM | POA: Diagnosis not present

## 2020-10-10 DIAGNOSIS — J3089 Other allergic rhinitis: Secondary | ICD-10-CM | POA: Diagnosis not present

## 2020-10-10 DIAGNOSIS — J301 Allergic rhinitis due to pollen: Secondary | ICD-10-CM | POA: Diagnosis not present

## 2020-10-15 DIAGNOSIS — D241 Benign neoplasm of right breast: Secondary | ICD-10-CM | POA: Diagnosis not present

## 2020-10-15 DIAGNOSIS — R920 Mammographic microcalcification found on diagnostic imaging of breast: Secondary | ICD-10-CM | POA: Diagnosis not present

## 2020-10-15 DIAGNOSIS — Z90710 Acquired absence of both cervix and uterus: Secondary | ICD-10-CM | POA: Diagnosis not present

## 2020-10-15 DIAGNOSIS — R928 Other abnormal and inconclusive findings on diagnostic imaging of breast: Secondary | ICD-10-CM | POA: Diagnosis not present

## 2020-10-15 DIAGNOSIS — R921 Mammographic calcification found on diagnostic imaging of breast: Secondary | ICD-10-CM | POA: Diagnosis not present

## 2020-10-15 DIAGNOSIS — Z780 Asymptomatic menopausal state: Secondary | ICD-10-CM | POA: Diagnosis not present

## 2020-10-23 DIAGNOSIS — J3089 Other allergic rhinitis: Secondary | ICD-10-CM | POA: Diagnosis not present

## 2020-10-23 DIAGNOSIS — J3081 Allergic rhinitis due to animal (cat) (dog) hair and dander: Secondary | ICD-10-CM | POA: Diagnosis not present

## 2020-10-23 DIAGNOSIS — J301 Allergic rhinitis due to pollen: Secondary | ICD-10-CM | POA: Diagnosis not present

## 2020-10-30 DIAGNOSIS — R69 Illness, unspecified: Secondary | ICD-10-CM | POA: Diagnosis not present

## 2020-10-30 DIAGNOSIS — I493 Ventricular premature depolarization: Secondary | ICD-10-CM | POA: Diagnosis not present

## 2020-10-30 DIAGNOSIS — M810 Age-related osteoporosis without current pathological fracture: Secondary | ICD-10-CM | POA: Diagnosis not present

## 2020-10-30 DIAGNOSIS — G4701 Insomnia due to medical condition: Secondary | ICD-10-CM | POA: Diagnosis not present

## 2020-10-30 DIAGNOSIS — E1165 Type 2 diabetes mellitus with hyperglycemia: Secondary | ICD-10-CM | POA: Diagnosis not present

## 2020-10-30 DIAGNOSIS — E7800 Pure hypercholesterolemia, unspecified: Secondary | ICD-10-CM | POA: Diagnosis not present

## 2020-10-30 DIAGNOSIS — N189 Chronic kidney disease, unspecified: Secondary | ICD-10-CM | POA: Diagnosis not present

## 2020-10-30 DIAGNOSIS — I10 Essential (primary) hypertension: Secondary | ICD-10-CM | POA: Diagnosis not present

## 2020-10-30 DIAGNOSIS — E039 Hypothyroidism, unspecified: Secondary | ICD-10-CM | POA: Diagnosis not present

## 2020-10-30 DIAGNOSIS — E559 Vitamin D deficiency, unspecified: Secondary | ICD-10-CM | POA: Diagnosis not present

## 2020-11-06 DIAGNOSIS — J3081 Allergic rhinitis due to animal (cat) (dog) hair and dander: Secondary | ICD-10-CM | POA: Diagnosis not present

## 2020-11-06 DIAGNOSIS — J301 Allergic rhinitis due to pollen: Secondary | ICD-10-CM | POA: Diagnosis not present

## 2020-11-06 DIAGNOSIS — J3089 Other allergic rhinitis: Secondary | ICD-10-CM | POA: Diagnosis not present

## 2021-06-19 ENCOUNTER — Other Ambulatory Visit: Payer: Self-pay | Admitting: Internal Medicine

## 2021-06-19 ENCOUNTER — Other Ambulatory Visit (HOSPITAL_COMMUNITY): Payer: Self-pay | Admitting: Internal Medicine

## 2021-06-19 DIAGNOSIS — N644 Mastodynia: Secondary | ICD-10-CM

## 2021-06-19 DIAGNOSIS — T148XXA Other injury of unspecified body region, initial encounter: Secondary | ICD-10-CM

## 2021-06-24 ENCOUNTER — Ambulatory Visit: Admission: RE | Admit: 2021-06-24 | Payer: Medicare Other | Source: Ambulatory Visit

## 2021-06-25 ENCOUNTER — Other Ambulatory Visit: Payer: Self-pay | Admitting: Student

## 2021-06-25 DIAGNOSIS — N644 Mastodynia: Secondary | ICD-10-CM

## 2021-06-25 DIAGNOSIS — T148XXA Other injury of unspecified body region, initial encounter: Secondary | ICD-10-CM

## 2021-06-25 DIAGNOSIS — R58 Hemorrhage, not elsewhere classified: Secondary | ICD-10-CM

## 2021-06-30 ENCOUNTER — Inpatient Hospital Stay
Admission: RE | Admit: 2021-06-30 | Discharge: 2021-06-30 | Disposition: A | Discharge status: Home / Self Care | Payer: Self-pay | Source: Ambulatory Visit | Attending: *Deleted | Admitting: *Deleted

## 2021-06-30 ENCOUNTER — Other Ambulatory Visit: Payer: Self-pay | Admitting: *Deleted

## 2021-06-30 ENCOUNTER — Inpatient Hospital Stay
Admission: RE | Admit: 2021-06-30 | Discharge: 2021-06-30 | Disposition: A | Discharge status: Home / Self Care | Payer: Medicare Other | Source: Ambulatory Visit | Attending: *Deleted | Admitting: *Deleted

## 2021-06-30 DIAGNOSIS — Z1231 Encounter for screening mammogram for malignant neoplasm of breast: Secondary | ICD-10-CM

## 2021-07-14 ENCOUNTER — Ambulatory Visit
Admission: RE | Admit: 2021-07-14 | Discharge: 2021-07-14 | Disposition: A | Discharge status: Home / Self Care | Payer: Medicare Other | Source: Ambulatory Visit | Attending: Student | Admitting: Student

## 2021-07-14 ENCOUNTER — Other Ambulatory Visit: Payer: Self-pay

## 2021-07-14 DIAGNOSIS — R58 Hemorrhage, not elsewhere classified: Secondary | ICD-10-CM

## 2021-07-14 DIAGNOSIS — N644 Mastodynia: Secondary | ICD-10-CM | POA: Insufficient documentation

## 2021-07-14 DIAGNOSIS — T148XXA Other injury of unspecified body region, initial encounter: Secondary | ICD-10-CM

## 2021-10-22 DIAGNOSIS — E782 Mixed hyperlipidemia: Secondary | ICD-10-CM | POA: Diagnosis not present

## 2021-10-22 DIAGNOSIS — J301 Allergic rhinitis due to pollen: Secondary | ICD-10-CM | POA: Diagnosis not present

## 2021-10-22 DIAGNOSIS — M810 Age-related osteoporosis without current pathological fracture: Secondary | ICD-10-CM | POA: Diagnosis not present

## 2021-10-22 DIAGNOSIS — K5730 Diverticulosis of large intestine without perforation or abscess without bleeding: Secondary | ICD-10-CM | POA: Diagnosis not present

## 2021-10-22 DIAGNOSIS — E039 Hypothyroidism, unspecified: Secondary | ICD-10-CM | POA: Diagnosis not present

## 2021-10-22 DIAGNOSIS — E559 Vitamin D deficiency, unspecified: Secondary | ICD-10-CM | POA: Diagnosis not present

## 2021-10-22 DIAGNOSIS — E1165 Type 2 diabetes mellitus with hyperglycemia: Secondary | ICD-10-CM | POA: Diagnosis not present

## 2021-10-22 DIAGNOSIS — E790 Hyperuricemia without signs of inflammatory arthritis and tophaceous disease: Secondary | ICD-10-CM | POA: Diagnosis not present

## 2021-10-22 DIAGNOSIS — J3089 Other allergic rhinitis: Secondary | ICD-10-CM | POA: Diagnosis not present

## 2021-10-22 DIAGNOSIS — J3081 Allergic rhinitis due to animal (cat) (dog) hair and dander: Secondary | ICD-10-CM | POA: Diagnosis not present

## 2021-10-22 DIAGNOSIS — N189 Chronic kidney disease, unspecified: Secondary | ICD-10-CM | POA: Diagnosis not present

## 2021-10-26 DIAGNOSIS — E039 Hypothyroidism, unspecified: Secondary | ICD-10-CM | POA: Diagnosis not present

## 2021-10-26 DIAGNOSIS — K5730 Diverticulosis of large intestine without perforation or abscess without bleeding: Secondary | ICD-10-CM | POA: Diagnosis not present

## 2021-10-26 DIAGNOSIS — N189 Chronic kidney disease, unspecified: Secondary | ICD-10-CM | POA: Diagnosis not present

## 2021-10-26 DIAGNOSIS — E1165 Type 2 diabetes mellitus with hyperglycemia: Secondary | ICD-10-CM | POA: Diagnosis not present

## 2021-10-26 DIAGNOSIS — E782 Mixed hyperlipidemia: Secondary | ICD-10-CM | POA: Diagnosis not present

## 2021-10-26 DIAGNOSIS — E790 Hyperuricemia without signs of inflammatory arthritis and tophaceous disease: Secondary | ICD-10-CM | POA: Diagnosis not present

## 2021-10-26 DIAGNOSIS — M810 Age-related osteoporosis without current pathological fracture: Secondary | ICD-10-CM | POA: Diagnosis not present

## 2021-10-26 DIAGNOSIS — E559 Vitamin D deficiency, unspecified: Secondary | ICD-10-CM | POA: Diagnosis not present

## 2021-10-29 DIAGNOSIS — N189 Chronic kidney disease, unspecified: Secondary | ICD-10-CM | POA: Diagnosis not present

## 2021-10-29 DIAGNOSIS — J3089 Other allergic rhinitis: Secondary | ICD-10-CM | POA: Diagnosis not present

## 2021-10-29 DIAGNOSIS — F3289 Other specified depressive episodes: Secondary | ICD-10-CM | POA: Diagnosis not present

## 2021-10-29 DIAGNOSIS — E559 Vitamin D deficiency, unspecified: Secondary | ICD-10-CM | POA: Diagnosis not present

## 2021-10-29 DIAGNOSIS — J3081 Allergic rhinitis due to animal (cat) (dog) hair and dander: Secondary | ICD-10-CM | POA: Diagnosis not present

## 2021-10-29 DIAGNOSIS — E7800 Pure hypercholesterolemia, unspecified: Secondary | ICD-10-CM | POA: Diagnosis not present

## 2021-10-29 DIAGNOSIS — M810 Age-related osteoporosis without current pathological fracture: Secondary | ICD-10-CM | POA: Diagnosis not present

## 2021-10-29 DIAGNOSIS — J301 Allergic rhinitis due to pollen: Secondary | ICD-10-CM | POA: Diagnosis not present

## 2021-10-29 DIAGNOSIS — I493 Ventricular premature depolarization: Secondary | ICD-10-CM | POA: Diagnosis not present

## 2021-10-29 DIAGNOSIS — G4701 Insomnia due to medical condition: Secondary | ICD-10-CM | POA: Diagnosis not present

## 2021-10-29 DIAGNOSIS — E039 Hypothyroidism, unspecified: Secondary | ICD-10-CM | POA: Diagnosis not present

## 2021-10-29 DIAGNOSIS — I10 Essential (primary) hypertension: Secondary | ICD-10-CM | POA: Diagnosis not present

## 2021-10-29 DIAGNOSIS — E1165 Type 2 diabetes mellitus with hyperglycemia: Secondary | ICD-10-CM | POA: Diagnosis not present

## 2021-11-05 DIAGNOSIS — J301 Allergic rhinitis due to pollen: Secondary | ICD-10-CM | POA: Diagnosis not present

## 2021-11-05 DIAGNOSIS — J3089 Other allergic rhinitis: Secondary | ICD-10-CM | POA: Diagnosis not present

## 2021-11-05 DIAGNOSIS — J3081 Allergic rhinitis due to animal (cat) (dog) hair and dander: Secondary | ICD-10-CM | POA: Diagnosis not present

## 2021-11-13 DIAGNOSIS — J3081 Allergic rhinitis due to animal (cat) (dog) hair and dander: Secondary | ICD-10-CM | POA: Diagnosis not present

## 2021-11-13 DIAGNOSIS — J3089 Other allergic rhinitis: Secondary | ICD-10-CM | POA: Diagnosis not present

## 2021-11-13 DIAGNOSIS — J301 Allergic rhinitis due to pollen: Secondary | ICD-10-CM | POA: Diagnosis not present

## 2021-11-27 DIAGNOSIS — J3089 Other allergic rhinitis: Secondary | ICD-10-CM | POA: Diagnosis not present

## 2021-11-27 DIAGNOSIS — J301 Allergic rhinitis due to pollen: Secondary | ICD-10-CM | POA: Diagnosis not present

## 2021-11-27 DIAGNOSIS — J3081 Allergic rhinitis due to animal (cat) (dog) hair and dander: Secondary | ICD-10-CM | POA: Diagnosis not present

## 2021-12-11 DIAGNOSIS — J301 Allergic rhinitis due to pollen: Secondary | ICD-10-CM | POA: Diagnosis not present

## 2021-12-11 DIAGNOSIS — J3081 Allergic rhinitis due to animal (cat) (dog) hair and dander: Secondary | ICD-10-CM | POA: Diagnosis not present

## 2021-12-11 DIAGNOSIS — J3089 Other allergic rhinitis: Secondary | ICD-10-CM | POA: Diagnosis not present

## 2021-12-25 DIAGNOSIS — J3089 Other allergic rhinitis: Secondary | ICD-10-CM | POA: Diagnosis not present

## 2021-12-25 DIAGNOSIS — J301 Allergic rhinitis due to pollen: Secondary | ICD-10-CM | POA: Diagnosis not present

## 2021-12-25 DIAGNOSIS — J3081 Allergic rhinitis due to animal (cat) (dog) hair and dander: Secondary | ICD-10-CM | POA: Diagnosis not present

## 2022-01-14 DIAGNOSIS — J3089 Other allergic rhinitis: Secondary | ICD-10-CM | POA: Diagnosis not present

## 2022-01-14 DIAGNOSIS — J301 Allergic rhinitis due to pollen: Secondary | ICD-10-CM | POA: Diagnosis not present

## 2022-01-14 DIAGNOSIS — J3081 Allergic rhinitis due to animal (cat) (dog) hair and dander: Secondary | ICD-10-CM | POA: Diagnosis not present

## 2022-03-04 DIAGNOSIS — I10 Essential (primary) hypertension: Secondary | ICD-10-CM | POA: Diagnosis not present

## 2022-03-04 DIAGNOSIS — E790 Hyperuricemia without signs of inflammatory arthritis and tophaceous disease: Secondary | ICD-10-CM | POA: Diagnosis not present

## 2022-03-04 DIAGNOSIS — M810 Age-related osteoporosis without current pathological fracture: Secondary | ICD-10-CM | POA: Diagnosis not present

## 2022-03-04 DIAGNOSIS — E039 Hypothyroidism, unspecified: Secondary | ICD-10-CM | POA: Diagnosis not present

## 2022-03-04 DIAGNOSIS — E782 Mixed hyperlipidemia: Secondary | ICD-10-CM | POA: Diagnosis not present

## 2022-03-04 DIAGNOSIS — E1165 Type 2 diabetes mellitus with hyperglycemia: Secondary | ICD-10-CM | POA: Diagnosis not present

## 2022-03-04 DIAGNOSIS — N189 Chronic kidney disease, unspecified: Secondary | ICD-10-CM | POA: Diagnosis not present

## 2022-03-04 DIAGNOSIS — E7800 Pure hypercholesterolemia, unspecified: Secondary | ICD-10-CM | POA: Diagnosis not present

## 2022-03-11 DIAGNOSIS — E039 Hypothyroidism, unspecified: Secondary | ICD-10-CM | POA: Diagnosis not present

## 2022-03-11 DIAGNOSIS — I10 Essential (primary) hypertension: Secondary | ICD-10-CM | POA: Diagnosis not present

## 2022-03-11 DIAGNOSIS — G4701 Insomnia due to medical condition: Secondary | ICD-10-CM | POA: Diagnosis not present

## 2022-03-11 DIAGNOSIS — F3289 Other specified depressive episodes: Secondary | ICD-10-CM | POA: Diagnosis not present

## 2022-03-11 DIAGNOSIS — E7800 Pure hypercholesterolemia, unspecified: Secondary | ICD-10-CM | POA: Diagnosis not present

## 2022-03-11 DIAGNOSIS — N189 Chronic kidney disease, unspecified: Secondary | ICD-10-CM | POA: Diagnosis not present

## 2022-03-11 DIAGNOSIS — E1165 Type 2 diabetes mellitus with hyperglycemia: Secondary | ICD-10-CM | POA: Diagnosis not present

## 2022-03-11 DIAGNOSIS — E559 Vitamin D deficiency, unspecified: Secondary | ICD-10-CM | POA: Diagnosis not present

## 2022-03-11 DIAGNOSIS — M810 Age-related osteoporosis without current pathological fracture: Secondary | ICD-10-CM | POA: Diagnosis not present

## 2022-03-11 DIAGNOSIS — I493 Ventricular premature depolarization: Secondary | ICD-10-CM | POA: Diagnosis not present

## 2022-05-07 DIAGNOSIS — I493 Ventricular premature depolarization: Secondary | ICD-10-CM | POA: Diagnosis not present

## 2022-05-07 DIAGNOSIS — Z1331 Encounter for screening for depression: Secondary | ICD-10-CM | POA: Diagnosis not present

## 2022-05-07 DIAGNOSIS — E1165 Type 2 diabetes mellitus with hyperglycemia: Secondary | ICD-10-CM | POA: Diagnosis not present

## 2022-05-07 DIAGNOSIS — Z23 Encounter for immunization: Secondary | ICD-10-CM | POA: Diagnosis not present

## 2022-05-07 DIAGNOSIS — E039 Hypothyroidism, unspecified: Secondary | ICD-10-CM | POA: Diagnosis not present

## 2022-05-07 DIAGNOSIS — N189 Chronic kidney disease, unspecified: Secondary | ICD-10-CM | POA: Diagnosis not present

## 2022-05-07 DIAGNOSIS — G4701 Insomnia due to medical condition: Secondary | ICD-10-CM | POA: Diagnosis not present

## 2022-05-07 DIAGNOSIS — I10 Essential (primary) hypertension: Secondary | ICD-10-CM | POA: Diagnosis not present

## 2022-05-07 DIAGNOSIS — F3289 Other specified depressive episodes: Secondary | ICD-10-CM | POA: Diagnosis not present

## 2022-05-07 DIAGNOSIS — Z0001 Encounter for general adult medical examination with abnormal findings: Secondary | ICD-10-CM | POA: Diagnosis not present

## 2022-05-07 DIAGNOSIS — Z1239 Encounter for other screening for malignant neoplasm of breast: Secondary | ICD-10-CM | POA: Diagnosis not present

## 2022-05-07 DIAGNOSIS — Z1211 Encounter for screening for malignant neoplasm of colon: Secondary | ICD-10-CM | POA: Diagnosis not present

## 2022-05-12 DIAGNOSIS — E7800 Pure hypercholesterolemia, unspecified: Secondary | ICD-10-CM | POA: Diagnosis not present

## 2022-05-12 DIAGNOSIS — E039 Hypothyroidism, unspecified: Secondary | ICD-10-CM | POA: Diagnosis not present

## 2022-05-12 DIAGNOSIS — M810 Age-related osteoporosis without current pathological fracture: Secondary | ICD-10-CM | POA: Diagnosis not present

## 2022-05-12 DIAGNOSIS — F3289 Other specified depressive episodes: Secondary | ICD-10-CM | POA: Diagnosis not present

## 2022-05-12 DIAGNOSIS — N189 Chronic kidney disease, unspecified: Secondary | ICD-10-CM | POA: Diagnosis not present

## 2022-05-12 DIAGNOSIS — G4701 Insomnia due to medical condition: Secondary | ICD-10-CM | POA: Diagnosis not present

## 2022-05-12 DIAGNOSIS — E559 Vitamin D deficiency, unspecified: Secondary | ICD-10-CM | POA: Diagnosis not present

## 2022-05-12 DIAGNOSIS — I10 Essential (primary) hypertension: Secondary | ICD-10-CM | POA: Diagnosis not present

## 2022-05-12 DIAGNOSIS — I493 Ventricular premature depolarization: Secondary | ICD-10-CM | POA: Diagnosis not present

## 2022-05-12 DIAGNOSIS — E1165 Type 2 diabetes mellitus with hyperglycemia: Secondary | ICD-10-CM | POA: Diagnosis not present

## 2022-07-01 DIAGNOSIS — R9431 Abnormal electrocardiogram [ECG] [EKG]: Secondary | ICD-10-CM | POA: Diagnosis not present

## 2022-07-01 DIAGNOSIS — R0989 Other specified symptoms and signs involving the circulatory and respiratory systems: Secondary | ICD-10-CM | POA: Diagnosis not present

## 2022-07-01 DIAGNOSIS — R0602 Shortness of breath: Secondary | ICD-10-CM | POA: Diagnosis not present

## 2024-01-20 ENCOUNTER — Ambulatory Visit: Admitting: Urology

## 2024-01-30 ENCOUNTER — Encounter: Payer: Self-pay | Admitting: Urology

## 2024-01-30 ENCOUNTER — Ambulatory Visit: Payer: Medicare (Managed Care) | Admitting: Urology

## 2024-01-30 VITALS — BP 153/81 | HR 58 | Ht 62.0 in | Wt 139.0 lb

## 2024-01-30 DIAGNOSIS — R32 Unspecified urinary incontinence: Secondary | ICD-10-CM

## 2024-01-30 DIAGNOSIS — Z87442 Personal history of urinary calculi: Secondary | ICD-10-CM

## 2024-01-30 LAB — URINALYSIS, COMPLETE
Bilirubin, UA: NEGATIVE
Glucose, UA: NEGATIVE
Nitrite, UA: NEGATIVE
Specific Gravity, UA: 1.02 (ref 1.005–1.030)
Urobilinogen, Ur: 0.2 mg/dL (ref 0.2–1.0)
pH, UA: 6 (ref 5.0–7.5)

## 2024-01-30 LAB — MICROSCOPIC EXAMINATION: Bacteria, UA: NONE SEEN

## 2024-01-30 LAB — BLADDER SCAN AMB NON-IMAGING

## 2024-01-30 NOTE — Progress Notes (Signed)
 I, Brianna Cole, acting as a scribe for Brianna JAYSON Barba, MD., have documented all relevant documentation on the behalf of Brianna JAYSON Barba, MD, as directed by Brianna JAYSON Barba, MD while in the presence of Brianna JAYSON Barba, MD.  01/30/2024 3:08 PM   Brianna Cole Nov 20, 1940 [id number]   Chief Complaint  Patient presents with   Establish Care    HPI: Brianna Cole is a 83 y.o. female self-referred for evaluation of urinary incontinence.   Prior history of stone disease, has seen both Dr. Penne and Dr. Nettie. Complains of severe urinary incontinence, primarily unaware incontinence, with no bothersome frequency and urgency. Wears diapers due to constant urine leakage without associated urge. Experiences nighttime leakage, denies flank or abdominal pain. Denies gross hematuria.   PMH: Past Medical History:  Diagnosis Date   Anxiety    Arthritis    Asthma    Cataract    Chronic kidney disease    Depression    Diabetes mellitus without complication ([facility])    Environmental allergies    Followed by Dr. Frutoso   GERD (gastroesophageal reflux disease)    History of IBS    History of kidney stones    Hyperlipidemia    Hypertension    Kidney stones    Thyroid  disease    Urinary incontinence     Surgical History: Past Surgical History:  Procedure Laterality Date   ABDOMINAL HYSTERECTOMY  1974   BREAST BIOPSY Right    10/2020  stereo fibroadenoma   CHOLECYSTECTOMY  1995   CYSTOSCOPY W/ RETROGRADES Bilateral 09/21/2016   Procedure: CYSTOSCOPY WITH RETROGRADE PYELOGRAM;  Surgeon: Rosina Penne, MD;  Location: [facility];  Service: Urology;  Laterality: Bilateral;   CYSTOSCOPY WITH STENT PLACEMENT Right 09/21/2016   Procedure: CYSTOSCOPY WITH STENT PLACEMENT;  Surgeon: Rosina Penne, MD;  Location: [facility];  Service: Urology;  Laterality: Right;   KIDNEY STONE SURGERY     1994   NASAL SINUS SURGERY     URETEROSCOPY WITH HOLMIUM LASER LITHOTRIPSY Right 09/21/2016    Procedure: URETEROSCOPY WITH HOLMIUM LASER LITHOTRIPSY;  Surgeon: Rosina Penne, MD;  Location: [facility];  Service: Urology;  Laterality: Right;   VAGINAL DELIVERY     2, 1x forceps delivery    Home Medications:  Allergies as of 01/30/2024       Reactions   Shellfish Allergy Hives, Swelling   Amoxicillin  Dermatitis, Other (See Comments)   Iodinated Contrast Media Rash   Wheat Other (See Comments)        Medication List        Accurate as of January 30, 2024  3:08 PM. If you have any questions, ask your nurse or doctor.          buPROPion 150 MG 12 hr tablet Commonly known as: WELLBUTRIN SR Take 150 mg by mouth 2 (two) times daily.   ciprofloxacin  500 MG tablet Commonly known as: CIPRO  Take 1 tablet (500 mg total) by mouth every 12 (twelve) hours.   diltiazem 180 MG 24 hr capsule Commonly known as: CARDIZEM CD Take 1 capsule (180 mg total) by mouth daily. What changed:  when to take this reasons to take this   EPINEPHrine 0.3 mg/0.3 mL Soaj injection Commonly known as: EPI-PEN as directed Injection PRN for systemic reaction; Duration: 30 days   estradiol  0.1 MG/GM vaginal cream Commonly known as: ESTRACE  VAGINAL Apply 0.5mg  (pea-sized amount)  just inside the vaginal introitus with a finger-tip every night for two  weeks and then Monday, Wednesday and Friday nights.   fexofenadine 180 MG tablet Commonly known as: ALLEGRA Take 180 mg by mouth daily as needed for allergies or rhinitis.   fluticasone 50 MCG/ACT nasal spray Commonly known as: FLONASE Place 1-2 sprays into both nostrils daily as needed for allergies or rhinitis.   Fluticasone-Salmeterol 100-50 MCG/DOSE Aepb Commonly known as: ADVAIR Inhale 1 puff into the lungs 2 (two) times daily as needed (for wheezing (respiratory issues)).   lansoprazole 15 MG capsule Commonly known as: PREVACID Take 15 mg by mouth at bedtime.   metoprolol succinate 25 MG 24 hr tablet Commonly known as: TOPROL-XL Take 25  mg by mouth daily.   ONE TOUCH ULTRA TEST test strip Generic drug: glucose blood 1 each by Other route See admin instructions. Up to twice daily (scheduled checks every morning)   pravastatin 20 MG tablet Commonly known as: PRAVACHOL Take 20 mg by mouth at bedtime.   ProAir HFA 108 (90 Base) MCG/ACT inhaler Generic drug: albuterol Inhale 2 puffs into the lungs every 6 (six) hours as needed for wheezing.   sertraline 100 MG tablet Commonly known as: ZOLOFT Take 100 mg by mouth daily.   Synthroid 75 MCG tablet Generic drug: levothyroxine 1 BY MOUTH EVERY MORNING , DO NOT SUBSTITUTE PLEASE.SABRA REPLACESDISCONTINUED LEVOXYL   SYSTANE OP Place 1-2 drops into both eyes 3 (three) times daily as needed (for dry eyes).   Tradjenta 5 MG Tabs tablet Generic drug: linagliptin Take 5 mg by mouth daily.   traZODone 50 MG tablet Commonly known as: DESYREL Take 50 mg by mouth at bedtime.   Vitamin D (Ergocalciferol) 1.25 MG (50000 UNIT) Caps capsule Commonly known as: DRISDOL Take 50,000 Units by mouth every Saturday.   zoledronic  acid 5 MG/100ML Soln injection Commonly known as: RECLAST  5 mg once.   ZyrTEC Allergy 10 MG tablet Generic drug: cetirizine 1 tablet Orally Once a day; Duration: 30 days        Allergies:  Allergies  Allergen Reactions   Shellfish Allergy Hives and Swelling   Amoxicillin  Dermatitis and Other (See Comments)   Iodinated Contrast Media Rash   Wheat Other (See Comments)    Family History: Family History  Problem Relation Age of Onset   Arthritis Mother    Hyperlipidemia Mother    Hypertension Mother    Kidney disease Mother    Diabetes Mother    Cancer Mother        colon   Cancer Father        lung   Breast cancer Sister    Breast cancer Niece    Bladder Cancer Neg Hx    Kidney cancer Neg Hx    Prostate cancer Neg Hx     Social History:  reports that she has never smoked. She has never used smokeless tobacco. She reports that she does  not drink alcohol and does not use drugs.   Physical Exam: BP (!) 153/81   Pulse (!) 58   Ht 5' 2 (1.575 m)   Wt 139 lb (63 kg)   BMI 25.42 kg/m   Constitutional:  Alert and oriented, No acute distress. HEENT: [name] AT, moist mucus membranes.  Trachea midline, no masses. Cardiovascular: No clubbing, cyanosis, or edema. Respiratory: Normal respiratory effort, no increased work of breathing. GI: Abdomen is soft, nontender, nondistended, no abdominal masses Skin: No rashes, bruises or suspicious lesions. Neurologic: Grossly intact, no focal deficits, moving all 4 extremities. Psychiatric: Normal mood and affect.  Urinalysis Dipstick trace blood/trace leukocyte/trace ketone, microscopy negative   Assessment & Plan:    1. Urinary incontinence Not associated with urge. Trial of Gemtesa to assess impact on incontinence. Follow up in one month with PA. If no significant improvement, consider referral to Dr. MacDiarmid for further evaluation.  [facility] [address] [place], [place] [address] [phone]

## 2024-03-01 ENCOUNTER — Ambulatory Visit: Payer: Medicare (Managed Care) | Admitting: Physician Assistant

## 2024-03-13 ENCOUNTER — Ambulatory Visit: Payer: Medicare (Managed Care) | Admitting: Physician Assistant

## 2024-03-13 VITALS — BP 171/70 | HR 53 | Ht 62.0 in | Wt 146.0 lb

## 2024-03-13 DIAGNOSIS — R32 Unspecified urinary incontinence: Secondary | ICD-10-CM

## 2024-03-13 LAB — BLADDER SCAN AMB NON-IMAGING

## 2024-03-13 MED ORDER — GEMTESA 75 MG PO TABS: 75.0000 mg | ORAL_TABLET | Freq: Every day | ORAL | 11 refills | Status: DC

## 2024-03-13 NOTE — Progress Notes (Unsigned)
 03/13/2024 11:06 AM   Brianna Cole 08-06-1940 [id number]  CC: Chief Complaint  Patient presents with   Follow-up   Urinary Incontinence   HPI: Brianna Cole is a 83 y.o. female with PMH nephrolithiasis and urinary incontinence without awareness who presents today for recheck on Gemtesa .   Today she reports ***  PVR 0mL.  PMH: Past Medical History:  Diagnosis Date   Anxiety    Arthritis    Asthma    Cataract    Chronic kidney disease    Depression    Diabetes mellitus without complication (HCC)    Environmental allergies    Followed by Dr. Frutoso   GERD (gastroesophageal reflux disease)    History of IBS    History of kidney stones    Hyperlipidemia    Hypertension    Kidney stones    Thyroid  disease    Urinary incontinence     Surgical History: Past Surgical History:  Procedure Laterality Date   ABDOMINAL HYSTERECTOMY  1974   BREAST BIOPSY Right    10/2020  stereo fibroadenoma   CHOLECYSTECTOMY  1995   CYSTOSCOPY W/ RETROGRADES Bilateral 09/21/2016   Procedure: CYSTOSCOPY WITH RETROGRADE PYELOGRAM;  Surgeon: Rosina Riis, MD;  Location: [facility];  Service: Urology;  Laterality: Bilateral;   CYSTOSCOPY WITH STENT PLACEMENT Right 09/21/2016   Procedure: CYSTOSCOPY WITH STENT PLACEMENT;  Surgeon: Rosina Riis, MD;  Location: [facility];  Service: Urology;  Laterality: Right;   KIDNEY STONE SURGERY     1994   NASAL SINUS SURGERY     URETEROSCOPY WITH HOLMIUM LASER LITHOTRIPSY Right 09/21/2016   Procedure: URETEROSCOPY WITH HOLMIUM LASER LITHOTRIPSY;  Surgeon: Rosina Riis, MD;  Location: [facility];  Service: Urology;  Laterality: Right;   VAGINAL DELIVERY     2, 1x forceps delivery    Home Medications:  Allergies as of 03/13/2024       Reactions   Shellfish Allergy Hives, Swelling   Amoxicillin  Dermatitis, Other (See Comments)   Iodinated Contrast Media Rash   Wheat Other (See Comments)        Medication List        Accurate as of  March 13, 2024 11:06 AM. If you have any questions, ask your nurse or doctor.          STOP taking these medications    estradiol  0.1 MG/GM vaginal cream Commonly known as: ESTRACE  VAGINAL       TAKE these medications    buPROPion 150 MG 12 hr tablet Commonly known as: WELLBUTRIN SR Take 150 mg by mouth 2 (two) times daily.   ciprofloxacin  500 MG tablet Commonly known as: CIPRO  Take 1 tablet (500 mg total) by mouth every 12 (twelve) hours.   diltiazem 180 MG 24 hr capsule Commonly known as: CARDIZEM CD Take 1 capsule (180 mg total) by mouth daily. What changed:  when to take this reasons to take this   EPINEPHrine 0.3 mg/0.3 mL Soaj injection Commonly known as: EPI-PEN as directed Injection PRN for systemic reaction; Duration: 30 days   fexofenadine 180 MG tablet Commonly known as: ALLEGRA Take 180 mg by mouth daily as needed for allergies or rhinitis.   fluticasone 50 MCG/ACT nasal spray Commonly known as: FLONASE Place 1-2 sprays into both nostrils daily as needed for allergies or rhinitis.   Fluticasone-Salmeterol 100-50 MCG/DOSE Aepb Commonly known as: ADVAIR Inhale 1 puff into the lungs 2 (two) times daily as needed (for wheezing (respiratory issues)).   lansoprazole 15  MG capsule Commonly known as: PREVACID Take 15 mg by mouth at bedtime.   metoprolol succinate 25 MG 24 hr tablet Commonly known as: TOPROL-XL Take 25 mg by mouth daily.   ONE TOUCH ULTRA TEST test strip Generic drug: glucose blood 1 each by Other route See admin instructions. Up to twice daily (scheduled checks every morning)   pravastatin 20 MG tablet Commonly known as: PRAVACHOL Take 20 mg by mouth at bedtime.   ProAir HFA 108 (90 Base) MCG/ACT inhaler Generic drug: albuterol Inhale 2 puffs into the lungs every 6 (six) hours as needed for wheezing.   sertraline 100 MG tablet Commonly known as: ZOLOFT Take 100 mg by mouth daily.   Synthroid 75 MCG tablet Generic drug:  levothyroxine 1 BY MOUTH EVERY MORNING , DO NOT SUBSTITUTE PLEASE.SABRA REPLACESDISCONTINUED LEVOXYL   SYSTANE OP Place 1-2 drops into both eyes 3 (three) times daily as needed (for dry eyes).   Tradjenta 5 MG Tabs tablet Generic drug: linagliptin Take 5 mg by mouth daily.   traZODone 50 MG tablet Commonly known as: DESYREL Take 50 mg by mouth at bedtime.   Vitamin D (Ergocalciferol) 1.25 MG (50000 UNIT) Caps capsule Commonly known as: DRISDOL Take 50,000 Units by mouth every Saturday.   zoledronic  acid 5 MG/100ML Soln injection Commonly known as: RECLAST  5 mg once.        Allergies:  Allergies  Allergen Reactions   Shellfish Allergy Hives and Swelling   Amoxicillin  Dermatitis and Other (See Comments)   Iodinated Contrast Media Rash   Wheat Other (See Comments)    Family History: Family History  Problem Relation Age of Onset   Arthritis Mother    Hyperlipidemia Mother    Hypertension Mother    Kidney disease Mother    Diabetes Mother    Cancer Mother        colon   Cancer Father        lung   Breast cancer Sister    Breast cancer Niece    Bladder Cancer Neg Hx    Kidney cancer Neg Hx    Prostate cancer Neg Hx     Social History:   reports that she has never smoked. She has never used smokeless tobacco. She reports that she does not drink alcohol and does not use drugs.  Physical Exam: BP (!) 171/70 (BP Location: Left Arm, Patient Position: Sitting, Cuff Size: Normal)   Pulse (!) 53   Ht 5' 2 (1.575 m)   Wt 146 lb (66.2 kg)   SpO2 96%   BMI 26.70 kg/m   Constitutional:  Alert and oriented, no acute distress, nontoxic appearing HEENT: [place], AT Cardiovascular: No clubbing, cyanosis, or edema Respiratory: Normal respiratory effort, no increased work of breathing GI: Abdomen is soft, nontender, nondistended, no abdominal masses GU: No CVA tenderness Lymph: No cervical or inguinal lymphadenopathy Skin: No rashes, bruises or suspicious lesions Neurologic:  Grossly intact, no focal deficits, moving all 4 extremities Psychiatric: Normal mood and affect  Laboratory Data: Lab Results  Component Value Date   WBC 8.0 09/14/2016   HGB 12.4 09/14/2016   HCT 37.3 09/14/2016   MCV 93 09/14/2016   PLT 218 09/14/2016    Lab Results  Component Value Date   CREATININE 1.81 (H) 10/05/2016    CrCl cannot be calculated (Patient's most recent lab result is older than the maximum 21 days allowed.).  Results for orders placed or performed in visit on 03/13/24  Bladder Scan (Post Void Residual)  in office   Collection Time: 03/13/24 10:55 AM  Result Value Ref Range   Scan Result 0ml     Pertinent Imaging: KUB, ***: *** Results for orders placed during the hospital encounter of 09/20/17  DG Abd 1 View  Narrative CLINICAL DATA:  Nephrolithiasis.  EXAM: ABDOMEN - 1 VIEW  COMPARISON:  Radiographs of March 22, 2017 and March 04, 2017. CT scan of September 09, 2016. Ultrasound of May 11, 2017.  FINDINGS: The bowel gas pattern is normal. No definite evidence of nephrolithiasis. Status post cholecystectomy. Stable rounded calcification is seen in right pelvis which may represent distal ureteral calculus.  IMPRESSION: No evidence of bowel obstruction or ileus. No definite nephrolithiasis is noted. Stable rounded calcification seen in right pelvis which may represent distal right ureteral calculus.   Electronically Signed By: Lynwood Landy Raddle, M.D. On: 09/20/2017 14:08  No results found for this or any previous visit.  No results found for this or any previous visit.  No results found for this or any previous visit.  Results for orders placed during the hospital encounter of 05/11/17  US  RENAL  Narrative CLINICAL DATA:  Right flank pain. Pelvic calcifications on plain film.  EXAM: RENAL / URINARY TRACT ULTRASOUND COMPLETE  COMPARISON:  Plain films of 03/22/2017.  CT of 09/09/2016.  FINDINGS: Right Kidney:  Length:  8.5 cm. Echogenicity within normal limits. No mass or hydronephrosis visualized.  Left Kidney:  Length: 8.9 cm. Echogenicity within normal limits. No mass or hydronephrosis visualized.  Bladder:  Appears normal for degree of bladder distention.  IMPRESSION: Normal renal ultrasound. No hydronephrosis or explanation for right flank pain. If high clinical concern of distal ureteric stone, recommend dedicated CT urogram.   Electronically Signed By: Rockey Kilts M.D. On: 05/12/2017 08:20  No results found for this or any previous visit.  No results found for this or any previous visit.  Results for orders placed during the hospital encounter of 09/09/16  CT RENAL STONE STUDY  Narrative CLINICAL DATA:  Right flank pain. Microscopic hematuria. Nephrolithiasis. Worsening urinary incontinence.  EXAM: CT ABDOMEN AND PELVIS WITHOUT CONTRAST  TECHNIQUE: Multidetector CT imaging of the abdomen and pelvis was performed following the standard protocol without IV contrast.  COMPARISON:  08/01/2007  FINDINGS: Lower chest: No acute findings.  Hepatobiliary: No masses visualized on this unenhanced exam. Prior cholecystectomy noted. No evidence of biliary dilatation.  Pancreas: No mass or inflammatory process visualized on this unenhanced exam.  Spleen:  Within normal limits in size.  Adrenals/Urinary tract: 2 right renal calculi are seen, largest in the upper pole measuring 11 mm. A tiny 1-2 mm calculus is seen in the upper pole of the left kidney. No evidence of ureteral calculi or hydronephrosis. Unopacified urinary bladder is unremarkable in appearance.  Stomach/Bowel: No evidence of obstruction, inflammatory process, or abnormal fluid collections. Normal appendix visualized.  Vascular/Lymphatic: No pathologically enlarged lymph nodes identified. No evidence of abdominal aortic aneurysm. Aortic atherosclerosis.  Reproductive: Prior hysterectomy noted. Adnexal  regions are unremarkable in appearance.  Other:  None.  Musculoskeletal:  No suspicious bone lesions identified.  IMPRESSION: Nonobstructing renal calculi. No evidence of ureteral calculi, hydronephrosis, or other acute findings.  Aortic atherosclerosis.   Electronically Signed By: Norleen Kil M.D. On: 09/09/2016 13:20   I personally reviewed the images referenced above and note ***.  Assessment & Plan:   1. Urinary incontinence, unspecified type (Primary) *** - Bladder Scan (Post Void Residual) in office   No follow-ups on  file.  Lucie Hones, PA-C  [facility] [address], [address] [place], [place] [address] [phone]

## 2024-04-05 ENCOUNTER — Telehealth: Payer: Self-pay | Admitting: Physician Assistant

## 2024-04-05 DIAGNOSIS — R32 Unspecified urinary incontinence: Secondary | ICD-10-CM

## 2024-04-05 NOTE — Telephone Encounter (Signed)
 Patient's son [place] called requesting a call back regarding Gemtesa  that was prescribed to patient. Son states that medication costs $100 and it is too expensive  and was told if that was the case to call back. Patient's son would like to know if there is another alternative. Please advise.

## 2024-04-06 MED ORDER — MIRABEGRON ER 50 MG PO TB24: 50.0000 mg | ORAL_TABLET | Freq: Every day | ORAL | 11 refills | Status: DC

## 2024-04-06 NOTE — Telephone Encounter (Signed)
 I have sent mirabegron  50 mg in as an alternative.  Antimuscarinics are contraindicated given her age and history of constipation and dry mouth.  If mirabegron  is also cost prohibitive, they need to call us  back so we can talk about other options.

## 2024-04-09 NOTE — Telephone Encounter (Signed)
 Phone call attempted. Voicemail left for a call back to the clinic.

## 2024-04-10 NOTE — Telephone Encounter (Signed)
 Phone call attempted, voicemail left for a callback to the clinic.

## 2024-04-10 NOTE — Telephone Encounter (Signed)
 I spoke with patient and read message from Whitney

## 2024-04-26 ENCOUNTER — Emergency Department
Admission: EM | Admit: 2024-04-26 | Discharge: 2024-04-26 | Disposition: A | Discharge status: Home / Self Care | Payer: Medicare (Managed Care) | Attending: Emergency Medicine | Admitting: Emergency Medicine

## 2024-04-26 ENCOUNTER — Other Ambulatory Visit: Payer: Self-pay

## 2024-04-26 ENCOUNTER — Emergency Department: Payer: Medicare (Managed Care)

## 2024-04-26 DIAGNOSIS — E1122 Type 2 diabetes mellitus with diabetic chronic kidney disease: Secondary | ICD-10-CM | POA: Diagnosis not present

## 2024-04-26 DIAGNOSIS — N189 Chronic kidney disease, unspecified: Secondary | ICD-10-CM | POA: Insufficient documentation

## 2024-04-26 DIAGNOSIS — I129 Hypertensive chronic kidney disease with stage 1 through stage 4 chronic kidney disease, or unspecified chronic kidney disease: Secondary | ICD-10-CM | POA: Insufficient documentation

## 2024-04-26 DIAGNOSIS — M25552 Pain in left hip: Secondary | ICD-10-CM

## 2024-04-26 DIAGNOSIS — S32592A Other specified fracture of left pubis, initial encounter for closed fracture: Secondary | ICD-10-CM | POA: Diagnosis not present

## 2024-04-26 DIAGNOSIS — S79912A Unspecified injury of left hip, initial encounter: Secondary | ICD-10-CM | POA: Diagnosis present

## 2024-04-26 DIAGNOSIS — W1839XA Other fall on same level, initial encounter: Secondary | ICD-10-CM | POA: Diagnosis not present

## 2024-04-26 DIAGNOSIS — W19XXXA Unspecified fall, initial encounter: Secondary | ICD-10-CM

## 2024-04-26 MED ORDER — ACETAMINOPHEN 325 MG PO TABS: 650.0000 mg | ORAL_TABLET | Freq: Four times a day (QID) | ORAL | 0 refills | Status: AC | PRN

## 2024-04-26 MED ORDER — ACETAMINOPHEN 325 MG PO TABS: 650.0000 mg | ORAL_TABLET | Freq: Four times a day (QID) | ORAL | 0 refills | Status: DC | PRN

## 2024-04-26 MED ORDER — ACETAMINOPHEN 325 MG PO TABS
650.0000 mg | ORAL_TABLET | Freq: Once | ORAL | Status: AC
  Administered 2024-04-26: 650 mg via ORAL
  Filled 2024-04-26: qty 2

## 2024-04-26 NOTE — ED Triage Notes (Signed)
 See first nurse note: patient states she fell on Tuesday and landed on left hip; unable to bear weight on left leg.

## 2024-04-26 NOTE — ED Triage Notes (Signed)
 Arrived by [facility] for mechanical fall on Saturday. C/o left hip pain. Unable to lift leg   Tylenol  gives some relief. Last taken at 11:!5am  EMS vitals: 178/75 b/p 106CBG 65HR 97% RA

## 2024-04-26 NOTE — Discharge Instructions (Addendum)
 Please take 650 mg of Tylenol  every 6 hours as needed for pain.  Please be sure to order follow-up with orthopedic surgery for further management of your symptoms.

## 2024-04-26 NOTE — ED Notes (Signed)
 See triage note.Presents via EMS from home s/p fall  States she fell  landed on her left side

## 2024-04-26 NOTE — ED Provider Notes (Signed)
 Brianna Cole Provider Note    Event Date/Time   First MD Initiated Contact with Patient 04/26/24 1532     (approximate)   History   Fall   HPI  Brianna Cole is a 83 y.o. female history of diabetes, CKD, depression, hyperlipidemia, hypertension, presenting with hip pain after fall.  Unable to weight-bear on her left lower extremity.  Fall was on Tuesday.  Patient states that she is typically unsteady on her feet, does use a walker sometimes.  States that she was reaching out for something in the bedroom and fell onto her left hip.  Denies any head strike.  Was able to ambulate after, states that she takes 250 mg of Tylenol  every 8 hours as needed for pain and that has helped her with her pain.  Per independent history from son, patient lives at home with her husband, he is close by to help out, states that she was in pain today and so brought her here for evaluation.  States that she has a rolling walker with a seat, house is a ranch house and has no steps.  Has been weightbearing at home.  Independent history from EMS, she had a fall on Saturday, slightly hypertensive, blood glucose was 106, was given some Tylenol  at 11 AM with some relief.     Physical Exam   Triage Vital Signs: ED Triage Vitals  Encounter Vitals Group     BP 04/26/24 1308 (!) 154/72     Girls Systolic BP Percentile --      Girls Diastolic BP Percentile --      Boys Systolic BP Percentile --      Boys Diastolic BP Percentile --      Pulse Rate 04/26/24 1308 62     Resp 04/26/24 1308 18     Temp 04/26/24 1308 98.2 F (36.8 C)     Temp Source 04/26/24 1308 Oral     SpO2 04/26/24 1308 96 %     Weight --      Height --      Head Circumference --      Peak Flow --      Pain Score 04/26/24 1307 7     Pain Loc --      Pain Education --      Exclude from Growth Chart --     Most recent vital signs: Vitals:   04/26/24 1308  BP: (!) 154/72  Pulse: 62  Resp: 18  Temp: 98.2 F  (36.8 C)  SpO2: 96%     General: Awake, no distress.  CV:  Good peripheral perfusion.  Resp:  Normal effort.  No thoracic cage tenderness Abd:  No distention.  Soft nontender Other:  No palpable skull deformities or tenderness, no midline spinal tenderness, full range of motion of all extremities are intact without focal weakness or numbness, no tenderness to the bilateral upper extremities or the right lower extremity, she has mild tenderness to the right lateral hip, no tenderness to the rest of her left lower extremity.  Palpable DP pulses bilaterally.   ED Results / Procedures / Treatments   Labs (all labs ordered are listed, but only abnormal results are displayed) Labs Reviewed - No data to display     RADIOLOGY On my independent interpretation, x-ray shows left pubic rami fracture.   PROCEDURES:  Critical Care performed: No  Procedures   MEDICATIONS ORDERED IN ED: Medications  acetaminophen  (TYLENOL ) tablet 650 mg (650 mg  Oral Given 04/26/24 1608)     IMPRESSION / MDM / ASSESSMENT AND PLAN / ED COURSE  I reviewed the triage vital signs and the nursing notes.                              Differential diagnosis includes, but is not limited to, contusion, strain, sprain, fracture.  She denies any head strike, no pain anywhere else.  X-ray was obtained out of triage.  Patient's presentation is most consistent with acute presentation with potential threat to life or bodily function.  Independent interpretation of imaging below.  Discussion with orthopedic surgery below.  Had extensive discussion with patient and son, discussed with them about SNF, rehab versus outpatient follow-up with orthopedic surgery and face-to-face with home health.  Patient would like to go home, son and patient states that she has a rolling walker with a chair, she is able to ambulate, there are no steps at home, has not needed increasing pain meds at home, would like a prescription for  Tylenol .  Will give her Tylenol  here as well.  Considered but no indication for inpatient admission at this time, she is safe for outpatient management.  Will place a face-to-face for home health and have her follow-up outpatient with orthopedic surgery.  Will give her prescription for Tylenol .  Shared decision making done with patient and family and they are agreeable with this plan.  Discharge.    Clinical Course as of 04/26/24 1621  Thu Apr 26, 2024  1542 DG Hip Unilat W or Wo Pelvis 2-3 Views Left IMPRESSION: Osteopenia. Displaced fractures of the left superior and inferior pubic rami   [TT]  1615 Consulted orthopedic surgery, he agrees that no surgical intervention, able to go home, states that she can do protective weightbearing ambulation with a walker.  Can follow-up with orthopedic surgery in 1 to 2 weeks. [TT]    Clinical Course User Index [TT] Waymond Lorelle Cummins, MD     FINAL CLINICAL IMPRESSION(S) / ED DIAGNOSES   Final diagnoses:  Fall, initial encounter  Pain of left hip  Closed fracture of multiple pubic rami, left, initial encounter (HCC)     Rx / DC Orders   ED Discharge Orders          Ordered    acetaminophen  (TYLENOL ) 325 MG tablet  Every 6 hours PRN,   Status:  Discontinued        04/26/24 1603    acetaminophen  (TYLENOL ) 325 MG tablet  Every 6 hours PRN        04/26/24 1603             Note:  This document was prepared using Dragon voice recognition software and may include unintentional dictation errors.    Waymond Lorelle Cummins, MD 04/26/24 [phone]

## 2024-04-30 ENCOUNTER — Telehealth: Payer: Self-pay

## 2024-04-30 NOTE — Telephone Encounter (Signed)
 LVM for pt to cb to make ED f/u with Dr Gust on 10/30

## 2024-05-01 ENCOUNTER — Telehealth: Payer: Self-pay

## 2024-05-01 MED ORDER — GEMTESA 75 MG PO TABS: 75.0000 mg | ORAL_TABLET | Freq: Every day | ORAL | 11 refills | Status: AC

## 2024-05-01 NOTE — Telephone Encounter (Signed)
Left another message for cb 

## 2024-05-01 NOTE — Telephone Encounter (Signed)
 Pts daughter called in and pt has decided that the Gemtesa  worked best for her and that they are wanting to get that now instead even with the higher price. I sent in refill to their pharmacy

## 2024-05-03 NOTE — Telephone Encounter (Signed)
 I talked to the pt and told her we need to see her back. She wants to discuss when it is best with her son and have him call back to schedule

## 2024-05-07 NOTE — Telephone Encounter (Signed)
 Left another message for the son to cb to schedule this appointment

## 2024-09-10 ENCOUNTER — Ambulatory Visit: Payer: Medicare (Managed Care) | Admitting: Urology
